# Patient Record
Sex: Female | Born: 1960 | Race: White | Hispanic: No | Marital: Married | Smoking: Former smoker
Health system: Southern US, Community
[De-identification: ages and names within clinical notes are randomized; demographics above are authoritative.]

## PROBLEM LIST (undated history)

## (undated) DIAGNOSIS — E039 Hypothyroidism, unspecified: Secondary | ICD-10-CM

## (undated) DIAGNOSIS — E038 Other specified hypothyroidism: Secondary | ICD-10-CM

## (undated) DIAGNOSIS — T7840XA Allergy, unspecified, initial encounter: Secondary | ICD-10-CM

## (undated) DIAGNOSIS — J45909 Unspecified asthma, uncomplicated: Secondary | ICD-10-CM

## (undated) HISTORY — DX: Hypothyroidism, unspecified: E03.9

## (undated) HISTORY — PX: SEPTOPLASTY: SHX2393

## (undated) HISTORY — DX: Other specified hypothyroidism: E03.8

## (undated) HISTORY — DX: Unspecified asthma, uncomplicated: J45.909

## (undated) HISTORY — DX: Allergy, unspecified, initial encounter: T78.40XA

## (undated) HISTORY — PX: MOUTH SURGERY: SHX715

---

## 2011-01-29 ENCOUNTER — Other Ambulatory Visit: Payer: Self-pay | Admitting: Family Medicine

## 2011-01-29 DIAGNOSIS — Z1231 Encounter for screening mammogram for malignant neoplasm of breast: Secondary | ICD-10-CM

## 2011-02-04 ENCOUNTER — Ambulatory Visit
Admission: RE | Admit: 2011-02-04 | Discharge: 2011-02-04 | Disposition: A | Discharge status: Home / Self Care | Payer: BC Managed Care – PPO | Source: Ambulatory Visit | Attending: Family Medicine | Admitting: Family Medicine

## 2011-02-04 DIAGNOSIS — Z1231 Encounter for screening mammogram for malignant neoplasm of breast: Secondary | ICD-10-CM

## 2011-02-12 ENCOUNTER — Other Ambulatory Visit: Payer: Self-pay | Admitting: Family Medicine

## 2011-02-12 DIAGNOSIS — R928 Other abnormal and inconclusive findings on diagnostic imaging of breast: Secondary | ICD-10-CM

## 2011-03-03 ENCOUNTER — Ambulatory Visit
Admission: RE | Admit: 2011-03-03 | Discharge: 2011-03-03 | Disposition: A | Discharge status: Home / Self Care | Payer: BC Managed Care – PPO | Source: Ambulatory Visit | Attending: Family Medicine | Admitting: Family Medicine

## 2011-03-03 DIAGNOSIS — R928 Other abnormal and inconclusive findings on diagnostic imaging of breast: Secondary | ICD-10-CM

## 2012-08-25 ENCOUNTER — Encounter: Payer: Self-pay | Admitting: Physician Assistant

## 2012-08-25 ENCOUNTER — Ambulatory Visit (INDEPENDENT_AMBULATORY_CARE_PROVIDER_SITE_OTHER): Payer: BC Managed Care – PPO | Admitting: Physician Assistant

## 2012-08-25 VITALS — BP 140/96 | HR 80 | Temp 98.5°F | Resp 18

## 2012-08-25 DIAGNOSIS — J22 Unspecified acute lower respiratory infection: Secondary | ICD-10-CM

## 2012-08-25 DIAGNOSIS — J988 Other specified respiratory disorders: Secondary | ICD-10-CM

## 2012-08-25 MED ORDER — ALBUTEROL SULFATE HFA 108 (90 BASE) MCG/ACT IN AERS: 2.0000 | INHALATION_SPRAY | Freq: Four times a day (QID) | RESPIRATORY_TRACT | Status: DC | PRN

## 2012-08-25 MED ORDER — AZITHROMYCIN 250 MG PO TABS: ORAL_TABLET | ORAL | Status: DC

## 2012-08-25 MED ORDER — PREDNISONE 20 MG PO TABS: ORAL_TABLET | ORAL | Status: DC

## 2012-08-25 NOTE — Progress Notes (Signed)
   Patient ID: KERISSA COIA MRN: [id number], DOB: 1960/09/08, 52 y.o. Date of Encounter: 08/25/2012, 12:26 PM    Chief Complaint:  Chief Complaint  Patient presents with  . wheezy, cough, sob, sinus pressure/drainage  discolored secr     HPI: 52 y.o. year old female reports that she has been sick for 7 days and getting worse. Now with a lot of chest congestion and cough. Yesterday had to use friend's inhaler-"If I hadn't had that, I would have had to go to ER" Has been wheezing. No h/o asthma. Has not smoked for 20 years.  Coughing up dark thick phlegm. Not blowing any mucus from nose. No fever, chills, sore throat, ear pain.    Home Meds: See Medication section for meds entered today No current outpatient prescriptions on file prior to visit.   No current facility-administered medications on file prior to visit.    Allergies:  Allergies  Allergen Reactions  . Erythromycin     Severe GI upset  Can take Azithromycin with no problem    Review of Systems: See HPI for Pertinent ROS. All othr ROS negative.   Physical Exam: Blood pressure 140/96, pulse 80, temperature 98.5 F (36.9 C), temperature source Oral, resp. rate 18, height 0' (0 m), weight 0 lb (0 kg), last menstrual period 08/04/2012., Cannot calculate BMI with a height equal to zero. General: Well developed, well nourished,WF in no acute distress. HEENT: Normocephalic, atraumatic, eyes without discharge, sclera non-icteric, nares are without discharge. Bilateral auditory canals clear, TM's are without perforation, pearly grey and translucent with reflective cone of light bilaterally. Oral cavity moist, posterior pharynx without exudate, erythema, peritonsillar abscess, or post nasal drip.  Neck: Supple. No thyromegaly. Full ROM. No lymphadenopathy. Lungs: She has a very congested cough. She has slight expiratory wheezes throughout but good air movement. Heart: RRR . No murmurs. Msk:  Strength and tone normal for  age. Extremities/Skin: Warm and dry. No clubbing or cyanosis. No edema. No rashes or suspicious lesions. Neuro: Alert and oriented X 3. Moves all extremities spontaneously. Gait is normal. CNII-XII grossly in tact. Psych:  Responds to questions appropriately with a normal affect.    ASSESSMENT AND PLAN:  52 y.o. year old female with  1. Lower respiratory tract infection - azithromycin (ZITHROMAX) 250 MG tablet; On Day 1: Take 2 pills daily On Days 2-5: Take 1 pill daily  Dispense: 6 tablet; Refill: 0 - albuterol (PROVENTIL HFA;VENTOLIN HFA) 108 (90 BASE) MCG/ACT inhaler; Inhale 2 puffs into the lungs every 6 (six) hours as needed for wheezing.  Dispense: 1 Inhaler; Refill: 0 - predniSONE (DELTASONE) 20 MG tablet; Take 3 daily for 2 days, then 2 daily for 2 days, then 1 daily for 2 days.  Dispense: 12 tablet; Refill: 0 Cont Mucinex DM. If worsens/ does not resolve, f/u.  [address] [place], [place], [facility] 08/25/2012 12:26 PM

## 2013-05-23 ENCOUNTER — Other Ambulatory Visit: Payer: Self-pay | Admitting: Physician Assistant

## 2013-05-23 NOTE — Telephone Encounter (Signed)
cpe in a few weeks.  One refill given

## 2013-05-25 ENCOUNTER — Other Ambulatory Visit: Payer: Self-pay | Admitting: Physician Assistant

## 2013-05-25 NOTE — Telephone Encounter (Signed)
CPE first week Feb.  One refill given

## 2013-06-15 ENCOUNTER — Other Ambulatory Visit: Payer: Self-pay | Admitting: Physician Assistant

## 2013-06-15 ENCOUNTER — Other Ambulatory Visit: Payer: BC Managed Care – PPO

## 2013-06-15 DIAGNOSIS — Z0000 Encounter for general adult medical examination without abnormal findings: Secondary | ICD-10-CM

## 2013-06-15 LAB — CBC WITH DIFFERENTIAL/PLATELET
BASOS ABS: 0.1 10*3/uL (ref 0.0–0.1)
BASOS PCT: 1 % (ref 0–1)
EOS ABS: 0.5 10*3/uL (ref 0.0–0.7)
Eosinophils Relative: 6 % — ABNORMAL HIGH (ref 0–5)
HCT: 40.2 % (ref 36.0–46.0)
Hemoglobin: 13.3 g/dL (ref 12.0–15.0)
Lymphocytes Relative: 23 % (ref 12–46)
Lymphs Abs: 1.9 10*3/uL (ref 0.7–4.0)
MCH: 29.6 pg (ref 26.0–34.0)
MCHC: 33.1 g/dL (ref 30.0–36.0)
MCV: 89.3 fL (ref 78.0–100.0)
MONOS PCT: 7 % (ref 3–12)
Monocytes Absolute: 0.6 10*3/uL (ref 0.1–1.0)
NEUTROS ABS: 5.3 10*3/uL (ref 1.7–7.7)
NEUTROS PCT: 63 % (ref 43–77)
PLATELETS: 318 10*3/uL (ref 150–400)
RBC: 4.5 MIL/uL (ref 3.87–5.11)
RDW: 13.4 % (ref 11.5–15.5)
WBC: 8.3 10*3/uL (ref 4.0–10.5)

## 2013-06-15 LAB — LIPID PANEL
CHOL/HDL RATIO: 3 ratio
Cholesterol: 208 mg/dL — ABNORMAL HIGH (ref 0–200)
HDL: 69 mg/dL (ref >39)
LDL CALC: 109 mg/dL — AB (ref 0–99)
Triglycerides: 152 mg/dL — ABNORMAL HIGH (ref <150)
VLDL: 30 mg/dL (ref 0–40)

## 2013-06-15 LAB — TSH: TSH: 4.946 u[IU]/mL — AB (ref 0.350–4.500)

## 2013-06-16 ENCOUNTER — Encounter: Payer: Self-pay | Admitting: Physician Assistant

## 2013-06-16 ENCOUNTER — Ambulatory Visit (INDEPENDENT_AMBULATORY_CARE_PROVIDER_SITE_OTHER): Payer: BC Managed Care – PPO | Admitting: Physician Assistant

## 2013-06-16 VITALS — BP 126/84 | HR 80 | Temp 97.9°F | Resp 18 | Ht 61.5 in | Wt 154.0 lb

## 2013-06-16 DIAGNOSIS — J22 Unspecified acute lower respiratory infection: Secondary | ICD-10-CM

## 2013-06-16 DIAGNOSIS — J45909 Unspecified asthma, uncomplicated: Secondary | ICD-10-CM | POA: Insufficient documentation

## 2013-06-16 DIAGNOSIS — J988 Other specified respiratory disorders: Secondary | ICD-10-CM

## 2013-06-16 DIAGNOSIS — Z309 Encounter for contraceptive management, unspecified: Secondary | ICD-10-CM | POA: Insufficient documentation

## 2013-06-16 DIAGNOSIS — Z23 Encounter for immunization: Secondary | ICD-10-CM

## 2013-06-16 DIAGNOSIS — E039 Hypothyroidism, unspecified: Secondary | ICD-10-CM | POA: Insufficient documentation

## 2013-06-16 DIAGNOSIS — T7840XA Allergy, unspecified, initial encounter: Secondary | ICD-10-CM | POA: Insufficient documentation

## 2013-06-16 DIAGNOSIS — Z0000 Encounter for general adult medical examination without abnormal findings: Secondary | ICD-10-CM | POA: Insufficient documentation

## 2013-06-16 DIAGNOSIS — E785 Hyperlipidemia, unspecified: Secondary | ICD-10-CM | POA: Insufficient documentation

## 2013-06-16 DIAGNOSIS — E038 Other specified hypothyroidism: Secondary | ICD-10-CM | POA: Insufficient documentation

## 2013-06-16 DIAGNOSIS — E559 Vitamin D deficiency, unspecified: Secondary | ICD-10-CM | POA: Insufficient documentation

## 2013-06-16 DIAGNOSIS — J302 Other seasonal allergic rhinitis: Secondary | ICD-10-CM | POA: Insufficient documentation

## 2013-06-16 DIAGNOSIS — J4520 Mild intermittent asthma, uncomplicated: Secondary | ICD-10-CM | POA: Insufficient documentation

## 2013-06-16 LAB — T4, FREE: FREE T4: 0.96 ng/dL (ref 0.80–1.80)

## 2013-06-16 LAB — VITAMIN D 25 HYDROXY (VIT D DEFICIENCY, FRACTURES): VIT D 25 HYDROXY: 40 ng/mL (ref 30–89)

## 2013-06-16 MED ORDER — ALBUTEROL SULFATE HFA 108 (90 BASE) MCG/ACT IN AERS: 2.0000 | INHALATION_SPRAY | Freq: Four times a day (QID) | RESPIRATORY_TRACT | Status: AC | PRN

## 2013-06-16 MED ORDER — NORGESTIMATE-ETH ESTRADIOL 0.25-35 MG-MCG PO TABS: ORAL_TABLET | ORAL | Status: DC

## 2013-06-16 NOTE — Progress Notes (Signed)
Patient ID: Terri Bean MRN: [id number], DOB: Jul 03, 1960, 53 y.o. Date of Encounter: 06/16/2013,   Chief Complaint: Physical (CPE)  HPI: 53 y.o. y/o female  here for CPE.   She reports that whenever her allergies act up she has some mild wheezing and uses her albuterol inhaler and his wheezing resolved easily with this. Says that this happens very rarely. The inhaler that she started this past April still has not run out.  She also wants to continue on her birth control pill. She is well aware that this can potentially increase risk of blood clots especially given her increasing age but wants to continue the medicine and is to accept this risk.  No other complaints. In general has been feeling very good. Does admit that her son has been very sick and she is constantly having to pick him up from school early etc. therefore has not gone for any mammogram since her last visit here. Says it would be impossible to get to it. Says that she has recently had to reschedule hair appointments 3 or 4 times before she can actually eating gets her hair appointments because of things going on with her son.   Review of Systems: Consitutional: No fever, chills, fatigue, night sweats, lymphadenopathy. No significant/unexplained weight changes. Eyes: No visual changes, eye redness, or discharge. ENT/Mouth: No ear pain, sore throat, nasal drainage, or sinus pain. Cardiovascular: No chest pressure,heaviness, tightness or squeezing, even with exertion. No increased shortness of breath or dyspnea on exertion.No palpitations, edema, orthopnea, PND. Respiratory: No cough, hemoptysis, SOB, or wheezing. Gastrointestinal: No anorexia, dysphagia, reflux, pain, nausea, vomiting, hematemesis, diarrhea, constipation, BRBPR, or melena. Breast: No mass, nodules, bulging, or retraction. No skin changes or inflammation. No nipple discharge. No lymphadenopathy. Genitourinary: No dysuria, hematuria, incontinence, vaginal  discharge, pruritis, burning, abnormal bleeding, or pain. Musculoskeletal: No decreased ROM, No joint pain or swelling. No significant pain in neck, back, or extremities. Skin: No rash, pruritis, or concerning lesions. Neurological: No headache, dizziness, syncope, seizures, tremors, memory loss, coordination problems, or paresthesias. Psychological: No anxiety, depression, hallucinations, SI/HI. Endocrine: No polydipsia, polyphagia, polyuria, or known diabetes.No increased fatigue. No palpitations/rapid heart rate. No significant/unexplained weight change. All other systems were reviewed and are otherwise negative.  Past Medical History  Diagnosis Date  . Allergy   . Asthma   . Subclinical hypothyroidism      Past Surgical History  Procedure Laterality Date  . Cesarean section      x 2  . Septoplasty    . Mouth surgery      Home Meds:  Current Outpatient Prescriptions on File Prior to Visit  Medication Sig Dispense Refill  . diphenhydrAMINE (SOMINEX) 25 MG tablet Take 25 mg by mouth at bedtime as needed for sleep.       No current facility-administered medications on file prior to visit.    Allergies:  Allergies  Allergen Reactions  . Erythromycin     Severe GI upset    History   Social History  . Marital Status: Married    Spouse Name: N/A    Number of Children: N/A  . Years of Education: N/A   Occupational History  . Not on file.   Social History Main Topics  . Smoking status: Former Smoker    Quit date: 08/25/1988  . Smokeless tobacco: Never Used  . Alcohol Use: No  . Drug Use: No  . Sexual Activity: Yes    Birth Control/ Protection: Pill   Other  Topics Concern  . Not on file   Social History Narrative   Works as Environmental health practitioner.   Does no formal/routine exercise but is very active in general    eats fairly healthy diet.    Family History  Problem Relation Age of Onset  . Cancer Mother     ovarian,peritoneal  . Heart disease Father       hx MI,pacemaker  . Hypertension Father     Physical Exam: Blood pressure 126/84, pulse 80, temperature 97.9 F (36.6 C), temperature source Oral, resp. rate 18, height 5' 1.5" (1.562 m), weight 154 lb (69.854 kg), last menstrual period 05/27/2013., Body mass index is 28.63 kg/(m^2). General: Well developed, well nourished,WF. Appears in no acute distress. HEENT: Normocephalic, atraumatic. Conjunctiva pink, sclera non-icteric. Pupils 2 mm constricting to 1 mm, round, regular, and equally reactive to light and accomodation. EOMI. Internal auditory canal clear. TMs with good cone of light and without pathology. Nasal mucosa pink. Nares are without discharge. No sinus tenderness. Oral mucosa pink.  Pharynx without exudate.   Neck: Supple. Trachea midline. No thyromegaly. Full ROM. No lymphadenopathy.No Carotid Bruits. Lungs: Clear to auscultation bilaterally without wheezes, rales, or rhonchi. Breathing is of normal effort and unlabored. Cardiovascular: RRR with S1 S2. No murmurs, rubs, or gallops. Distal pulses 2+ symmetrically. No carotid or abdominal bruits. Breast: Symmetrical. No masses. Nipples without discharge. Abdomen: Soft, non-tender, non-distended with normoactive bowel sounds. No hepatosplenomegaly or masses. No rebound/guarding. No CVA tenderness. No hernias.  Genitourinary:  External genitalia without lesions. Vaginal mucosa pink.No discharge present. Cervix pink and without discharge. No cervical tenderness.Normal uterus size. No adnexal mass or tenderness.  Musculoskeletal: Full range of motion and 5/5 strength throughout. Without swelling, atrophy, tenderness, crepitus, or warmth. Extremities without clubbing, cyanosis, or edema. Calves supple. Skin: Warm and moist without erythema, ecchymosis, wounds, or rash. Neuro: A+Ox3. CN II-XII grossly intact. Moves all extremities spontaneously. Full sensation throughout. Normal gait. DTR 2+ throughout upper and lower extremities. Finger to  nose intact. Psych:  Responds to questions appropriately with a normal affect.   Assessment/Plan:  53 y.o. y/o female here for CPE 1. Visit for preventive health examination   A. Screening Labs: SHe came in had these done fasting yesterday morning. These are all excellent. TSH is borderline so we'll add a free T4 to further evaluate this.  B. Pap: Last Pap smear was 01/2011. At that pap she had both cytology and had cotesting with HPV. Therefore can wait 5 years to repeat. Due September 2017.  C. Screening Mammogram: Last one was 03/03/2011. Negative. See history of present illness. She is well aware that mammogram is due and needs to be done but see history of present illness. She will call to schedule this as soon as she possibly can given her schedule and problems with her son.  E. Colorectal Cancer Screening: Discussed that now that she is age 32 colonoscopy is recommended. Again, see history of present illness. She will call me if things calm down in regards to her son and her schedule and then we can refer her at that time.  F. Immunizations:  Tetanus: Has not had in greater than 10 years. This is due. She is agreeable and will get this today.     2. Contraception management - norgestimate-ethinyl estradiol (SPRINTEC 28) 0.25-35 MG-MCG tablet; USE AS DIRECTED  Dispense: 28 tablet; Refill: 11  3. Subclinical hypothyroidism TSH was done when lab work yesterday we'll add a free T4.  4. Allergy  5. Asthma - albuterol (PROVENTIL HFA;VENTOLIN HFA) 108 (90 BASE) MCG/ACT inhaler; Inhale 2 puffs into the lungs every 6 (six) hours as needed for wheezing.  Dispense: 1 Inhaler; Refill: 0  F/U Office visit one year or sooner if needed.  Signed, [address] [place], [place], [facility] 06/16/2013 9:03 AM

## 2014-05-02 ENCOUNTER — Other Ambulatory Visit: Payer: Self-pay

## 2014-05-02 DIAGNOSIS — Z1231 Encounter for screening mammogram for malignant neoplasm of breast: Secondary | ICD-10-CM

## 2014-05-10 ENCOUNTER — Other Ambulatory Visit: Payer: Self-pay

## 2014-05-10 ENCOUNTER — Ambulatory Visit
Admission: RE | Admit: 2014-05-10 | Discharge: 2014-05-10 | Disposition: A | Discharge status: Home / Self Care | Payer: BC Managed Care – PPO | Source: Ambulatory Visit

## 2014-05-10 DIAGNOSIS — Z1231 Encounter for screening mammogram for malignant neoplasm of breast: Secondary | ICD-10-CM

## 2014-06-12 ENCOUNTER — Encounter: Payer: Self-pay | Admitting: Family Medicine

## 2014-06-12 ENCOUNTER — Other Ambulatory Visit: Payer: Self-pay | Admitting: Physician Assistant

## 2014-06-12 NOTE — Telephone Encounter (Signed)
Medication refill for one time only.  Patient needs to be seen.  Letter sent for patient to call and schedule 

## 2014-07-08 ENCOUNTER — Other Ambulatory Visit: Payer: Self-pay | Admitting: Physician Assistant

## 2014-07-10 NOTE — Telephone Encounter (Signed)
Medication refilled per protocol.  Has CPE next week

## 2014-07-19 ENCOUNTER — Encounter: Payer: Self-pay | Admitting: Physician Assistant

## 2014-08-05 ENCOUNTER — Other Ambulatory Visit: Payer: Self-pay | Admitting: Physician Assistant

## 2014-08-07 NOTE — Telephone Encounter (Signed)
Medication refilled per protocol. 

## 2014-08-16 ENCOUNTER — Encounter: Payer: Self-pay | Admitting: Physician Assistant

## 2014-09-03 ENCOUNTER — Other Ambulatory Visit: Payer: Self-pay | Admitting: Physician Assistant

## 2014-09-04 ENCOUNTER — Other Ambulatory Visit: Payer: BLUE CROSS/BLUE SHIELD

## 2014-09-04 DIAGNOSIS — Z79899 Other long term (current) drug therapy: Secondary | ICD-10-CM

## 2014-09-04 DIAGNOSIS — Z308 Encounter for other contraceptive management: Secondary | ICD-10-CM

## 2014-09-04 DIAGNOSIS — E039 Hypothyroidism, unspecified: Secondary | ICD-10-CM

## 2014-09-04 DIAGNOSIS — Z0000 Encounter for general adult medical examination without abnormal findings: Secondary | ICD-10-CM

## 2014-09-04 DIAGNOSIS — E038 Other specified hypothyroidism: Secondary | ICD-10-CM

## 2014-09-04 LAB — CBC WITH DIFFERENTIAL/PLATELET
BASOS ABS: 0.1 10*3/uL (ref 0.0–0.1)
Basophils Relative: 1 % (ref 0–1)
EOS PCT: 8 % — AB (ref 0–5)
Eosinophils Absolute: 0.6 10*3/uL (ref 0.0–0.7)
HEMATOCRIT: 40.4 % (ref 36.0–46.0)
Hemoglobin: 13.7 g/dL (ref 12.0–15.0)
LYMPHS ABS: 2 10*3/uL (ref 0.7–4.0)
LYMPHS PCT: 26 % (ref 12–46)
MCH: 30.8 pg (ref 26.0–34.0)
MCHC: 33.9 g/dL (ref 30.0–36.0)
MCV: 90.8 fL (ref 78.0–100.0)
MPV: 9.7 fL (ref 8.6–12.4)
Monocytes Absolute: 0.6 10*3/uL (ref 0.1–1.0)
Monocytes Relative: 8 % (ref 3–12)
NEUTROS PCT: 57 % (ref 43–77)
Neutro Abs: 4.4 10*3/uL (ref 1.7–7.7)
Platelets: 318 10*3/uL (ref 150–400)
RBC: 4.45 MIL/uL (ref 3.87–5.11)
RDW: 13.9 % (ref 11.5–15.5)
WBC: 7.8 10*3/uL (ref 4.0–10.5)

## 2014-09-04 LAB — LIPID PANEL
Cholesterol: 215 mg/dL — ABNORMAL HIGH (ref 0–200)
HDL: 72 mg/dL (ref >=46)
LDL Cholesterol: 113 mg/dL — ABNORMAL HIGH (ref 0–99)
TRIGLYCERIDES: 151 mg/dL — AB (ref <150)
Total CHOL/HDL Ratio: 3 Ratio
VLDL: 30 mg/dL (ref 0–40)

## 2014-09-04 LAB — COMPLETE METABOLIC PANEL WITH GFR
ALT: 11 U/L (ref 0–35)
AST: 15 U/L (ref 0–37)
Albumin: 3.9 g/dL (ref 3.5–5.2)
Alkaline Phosphatase: 50 U/L (ref 39–117)
BUN: 10 mg/dL (ref 6–23)
CALCIUM: 9.1 mg/dL (ref 8.4–10.5)
CHLORIDE: 103 meq/L (ref 96–112)
CO2: 27 mEq/L (ref 19–32)
Creat: 0.66 mg/dL (ref 0.50–1.10)
GFR, Est Non African American: >89 mL/min
Glucose, Bld: 77 mg/dL (ref 70–99)
Potassium: 4.9 mEq/L (ref 3.5–5.3)
SODIUM: 138 meq/L (ref 135–145)
Total Bilirubin: 0.4 mg/dL (ref 0.2–1.2)
Total Protein: 6.5 g/dL (ref 6.0–8.3)

## 2014-09-04 LAB — TSH: TSH: 5.871 u[IU]/mL — ABNORMAL HIGH (ref 0.350–4.500)

## 2014-09-04 LAB — T4, FREE: FREE T4: 0.82 ng/dL (ref 0.80–1.80)

## 2014-09-04 NOTE — Telephone Encounter (Signed)
One refill to hold until up coming CPE

## 2014-09-06 ENCOUNTER — Encounter: Payer: Self-pay | Admitting: Physician Assistant

## 2014-09-06 ENCOUNTER — Ambulatory Visit (INDEPENDENT_AMBULATORY_CARE_PROVIDER_SITE_OTHER): Payer: BLUE CROSS/BLUE SHIELD | Admitting: Physician Assistant

## 2014-09-06 VITALS — BP 124/76 | HR 72 | Temp 98.0°F | Resp 18

## 2014-09-06 DIAGNOSIS — Z0000 Encounter for general adult medical examination without abnormal findings: Secondary | ICD-10-CM | POA: Diagnosis not present

## 2014-09-06 DIAGNOSIS — Z3041 Encounter for surveillance of contraceptive pills: Secondary | ICD-10-CM

## 2014-09-06 DIAGNOSIS — J4520 Mild intermittent asthma, uncomplicated: Secondary | ICD-10-CM

## 2014-09-06 DIAGNOSIS — E038 Other specified hypothyroidism: Secondary | ICD-10-CM

## 2014-09-06 DIAGNOSIS — E039 Hypothyroidism, unspecified: Secondary | ICD-10-CM

## 2014-09-06 NOTE — Progress Notes (Signed)
Patient ID: KEIANNA SIGNER MRN: [id number], DOB: 12/18/1960, 54 y.o. Date of Encounter: 09/06/2014,   Chief Complaint: Physical (CPE)  HPI: 54 y.o. y/o female  here for CPE.   Her last visit here was for her annual complete physical exam with me 06/16/2013. She states that she has had no medical issues arise since then. Has been feeling good over the past year.  She reports that whenever her allergies act up she has some mild wheezing and uses her albuterol inhaler and his wheezing resolved easily with this. Says that this happens very rarely.  At her CPE 06/2013--She wanted to continue on her birth control pill. She said she was well aware that this can potentially increase risk of blood clots especially given her increasing age but wanted to continue the medicine and accepted this risk. At CPE 08/2014 when I discussed stopping this she says that she is agreeable to try going off of this for couple months and see whether she even has menses. Does not know whether she may be menopausal at this point. She says that she knows at this age the chances of getting pregnant are extremely low, but she says that she wants to know that the chances are 0.  No other complaints. In general has been feeling very good. At her CPE 06/2013--she said that her son has been very sick and she was constantly having to pick him up from school early etc. therefore had not gone for any mammogram since her last visit here. Michela Pitcher it would be impossible to get to it. Said that she has recently had to reschedule hair appointments 3 or 4 times before she can actually eating gets her hair appointments because of things going on with her son. At visit 08/2014 discussed this.  Says that her son has a lot of problems with panic anxiety and depression. Says that he is graduating high school this year then plans to go to [place] but will be living at home. Says that if someone met him they would not even notice there was anything wrong  but that he does have to take multiple medications to be stable. Says currently he has been stable and doing well recently but she never knows when he may have a flare. She says that she was recently able to go get her mammogram and this is now up-to-date! However still is reluctant to schedule colonoscopy because she never knows when something is going to, but the last second and she cannot get to things. Also says that with things being busy with his graduation etc. she knows it will be at least be Fall of this year before she could possibly schedule colonoscopy.   Review of Systems: Consitutional: No fever, chills, fatigue, night sweats, lymphadenopathy. No significant/unexplained weight changes. Eyes: No visual changes, eye redness, or discharge. ENT/Mouth: No ear pain, sore throat, nasal drainage, or sinus pain. Cardiovascular: No chest pressure,heaviness, tightness or squeezing, even with exertion. No increased shortness of breath or dyspnea on exertion.No palpitations, edema, orthopnea, PND. Respiratory: No cough, hemoptysis, SOB, or wheezing. Gastrointestinal: No anorexia, dysphagia, reflux, pain, nausea, vomiting, hematemesis, diarrhea, constipation, BRBPR, or melena. Breast: No mass, nodules, bulging, or retraction. No skin changes or inflammation. No nipple discharge. No lymphadenopathy. Genitourinary: No dysuria, hematuria, incontinence, vaginal discharge, pruritis, burning, abnormal bleeding, or pain. Musculoskeletal: No decreased ROM, No joint pain or swelling. No significant pain in neck, back, or extremities. Skin: No rash, pruritis, or concerning lesions. Neurological: No  headache, dizziness, syncope, seizures, tremors, memory loss, coordination problems, or paresthesias. Psychological: No anxiety, depression, hallucinations, SI/HI. Endocrine: No polydipsia, polyphagia, polyuria, or known diabetes.No increased fatigue. No palpitations/rapid heart rate. No significant/unexplained  weight change. All other systems were reviewed and are otherwise negative.  Past Medical History  Diagnosis Date  . Allergy   . Asthma   . Subclinical hypothyroidism      Past Surgical History  Procedure Laterality Date  . Cesarean section      x 2  . Septoplasty    . Mouth surgery      Home Meds:  Current Outpatient Prescriptions on File Prior to Visit  Medication Sig Dispense Refill  . albuterol (PROVENTIL HFA;VENTOLIN HFA) 108 (90 BASE) MCG/ACT inhaler Inhale 2 puffs into the lungs every 6 (six) hours as needed for wheezing. 1 Inhaler 0  . diphenhydrAMINE (SOMINEX) 25 MG tablet Take 25 mg by mouth at bedtime as needed for sleep.    . SPRINTEC 28 0.25-35 MG-MCG tablet TAKE 1 TABLET BY MOUTH EVERY DAY 28 tablet 0   No current facility-administered medications on file prior to visit.    Allergies:  Allergies  Allergen Reactions  . Erythromycin     Severe GI upset    History   Social History  . Marital Status: Married    Spouse Name: N/A  . Number of Children: N/A  . Years of Education: N/A   Occupational History  . Not on file.   Social History Main Topics  . Smoking status: Former Smoker    Quit date: 08/25/1988  . Smokeless tobacco: Never Used  . Alcohol Use: No  . Drug Use: No  . Sexual Activity: Yes    Birth Control/ Protection: Pill   Other Topics Concern  . Not on file   Social History Narrative   Works as Web designer.   Does no formal/routine exercise but is very active in general    eats fairly healthy diet.   Married.    Has 2 children.   Entered 08/2014: Her son graduates highschool this year. Then plans to go to [employer]--will live at home.    He has a lot of problems with Panic, Anxiety, Depression.    At times, when his problems are not stable, pt has to be present for a lot of appointments with him, etc.   She also has a daughter who is age 15 and lives in [place] and works as a Haematologist.  Family History  Problem  Relation Age of Onset  . Cancer Mother     ovarian,peritoneal  . Heart disease Father     hx MI,pacemaker  . Hypertension Father     Physical Exam: Blood pressure 124/76, pulse 72, temperature 98 F (36.7 C), temperature source Oral, resp. rate 18, last menstrual period 08/25/2014., There is no weight on file to calculate BMI. General: Well developed, well nourished,WF. Appears in no acute distress. HEENT: Normocephalic, atraumatic. Conjunctiva pink, sclera non-icteric. Pupils 2 mm constricting to 1 mm, round, regular, and equally reactive to light and accomodation. EOMI. Internal auditory canal clear. TMs with good cone of light and without pathology. Nasal mucosa pink. Nares are without discharge. No sinus tenderness. Oral mucosa pink.  Pharynx without exudate.   Neck: Supple. Trachea midline. No thyromegaly. Full ROM. No lymphadenopathy.No Carotid Bruits. Lungs: Clear to auscultation bilaterally without wheezes, rales, or rhonchi. Breathing is of normal effort and unlabored. Cardiovascular: RRR with S1 S2. No murmurs, rubs, or gallops. Distal pulses 2+  symmetrically. No carotid or abdominal bruits. Breast: Symmetrical. No masses. Nipples without discharge. Abdomen: Soft, non-tender, non-distended with normoactive bowel sounds. No hepatosplenomegaly or masses. No rebound/guarding. No CVA tenderness. No hernias.  Genitourinary:  External genitalia without lesions. Vaginal mucosa pink.No discharge present. Cervix pink and without discharge. No cervical tenderness.Normal uterus size. No adnexal mass or tenderness.  Musculoskeletal: Full range of motion and 5/5 strength throughout. Without swelling, atrophy, tenderness, crepitus, or warmth. Extremities without clubbing, cyanosis, or edema. Calves supple. Skin: Warm and moist without erythema, ecchymosis, wounds, or rash. Neuro: A+Ox3. CN II-XII grossly intact. Moves all extremities spontaneously. Full sensation throughout. Normal gait. DTR 2+  throughout upper and lower extremities. Finger to nose intact. Psych:  Responds to questions appropriately with a normal affect.   Assessment/Plan:  54 y.o. y/o female here for CPE 1. Visit for preventive health examination   A. Screening Labs: SHe came in had these done fasting yesterday. These are all excellent. TSH slightly elevated,  free T4 normal--will cont to monitor this.  B. Pap: Last Pap smear was 01/2011. At that pap she had both cytology and had cotesting with HPV. Therefore can wait 5 years to repeat. Due September 2017.  C. Screening Mammogram: At visit 08/2014 she reports that she did recently have mammogram which was negative.  E. Colorectal Cancer Screening: Discussed that now that she is age 71 colonoscopy is recommended. Again, see history of present illness. She will call me if things calm down in regards to her son and her schedule and then we can refer her at that time.  F. Immunizations:  Influenza: N/A Tetanus: Tdap given here 06/16/2013 Pneumonia vaccine--- and wait until age 23 for this. Technically she has asthma but this is very rare. Zostavax--Discuss at age 55      2. Contraception management She is agreeable to have a trial off of OCT  3. Subclinical hypothyroidism Stable. We'll continue to monitor  4. Allergy  5. Asthma - albuterol (PROVENTIL HFA;VENTOLIN HFA) 108 (90 BASE) MCG/ACT inhaler; Inhale 2 puffs into the lungs every 6 (six) hours as needed for wheezing.  Dispense: 1 Inhaler; Refill: 0  F/U Office visit one year or sooner if needed.  Signed, [address] [place], [place], [facility] 09/06/2014 11:20 AM

## 2014-09-23 ENCOUNTER — Other Ambulatory Visit: Payer: Self-pay | Admitting: Physician Assistant

## 2014-09-25 NOTE — Telephone Encounter (Signed)
Medication refilled per protocol. 

## 2015-07-11 IMAGING — MG MM SCREEN MAMMOGRAM BILATERAL
6 series · 6 of 6 positions shown · non-contrast
Comparison: Previous exam(s).

CLINICAL DATA: Screening.

EXAM:
DIGITAL SCREENING BILATERAL MAMMOGRAM WITH CAD

[R CC (1 of 2)]
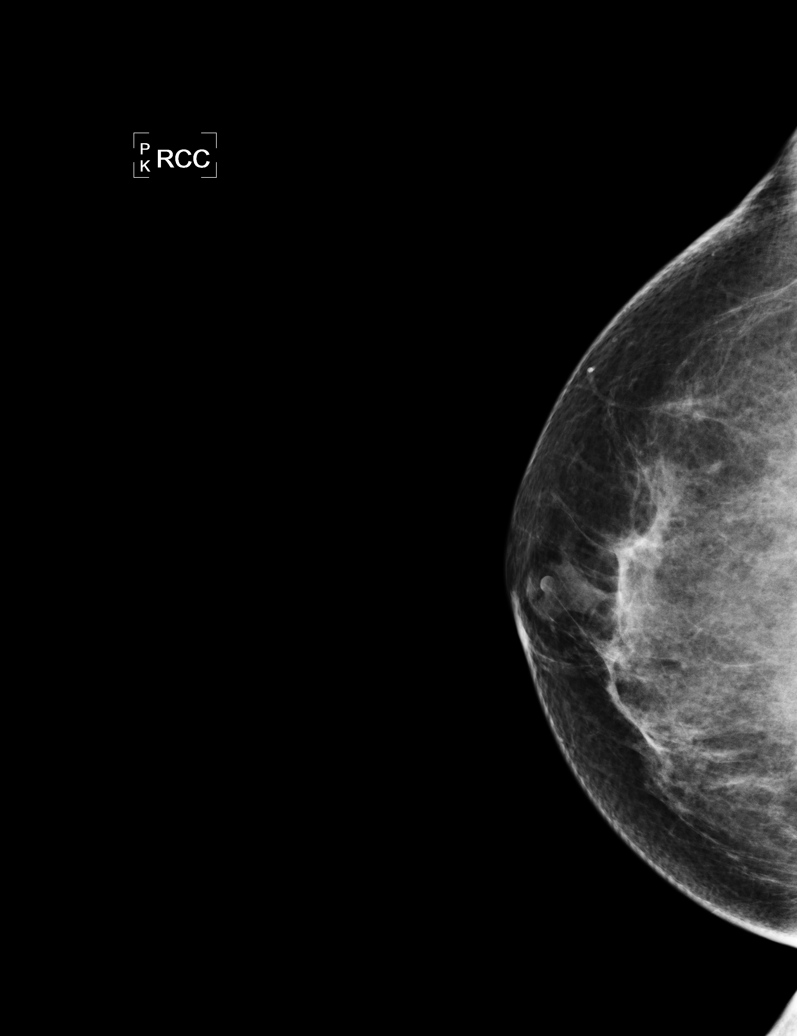

[L CC]
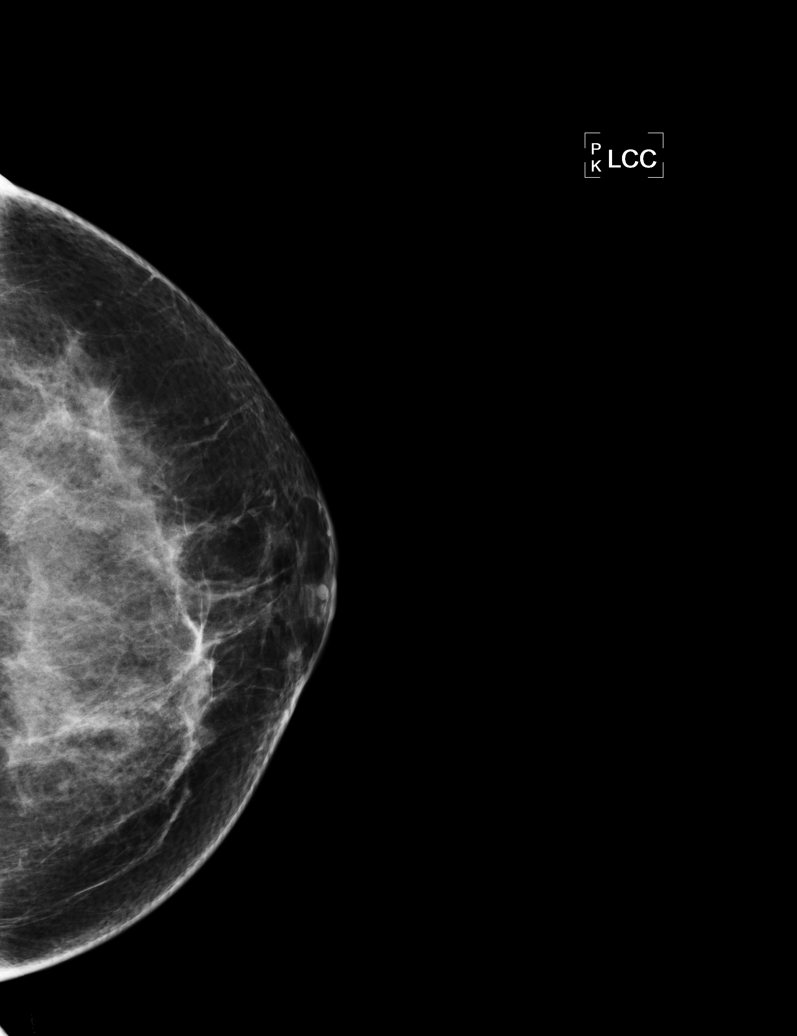

[L MLO]
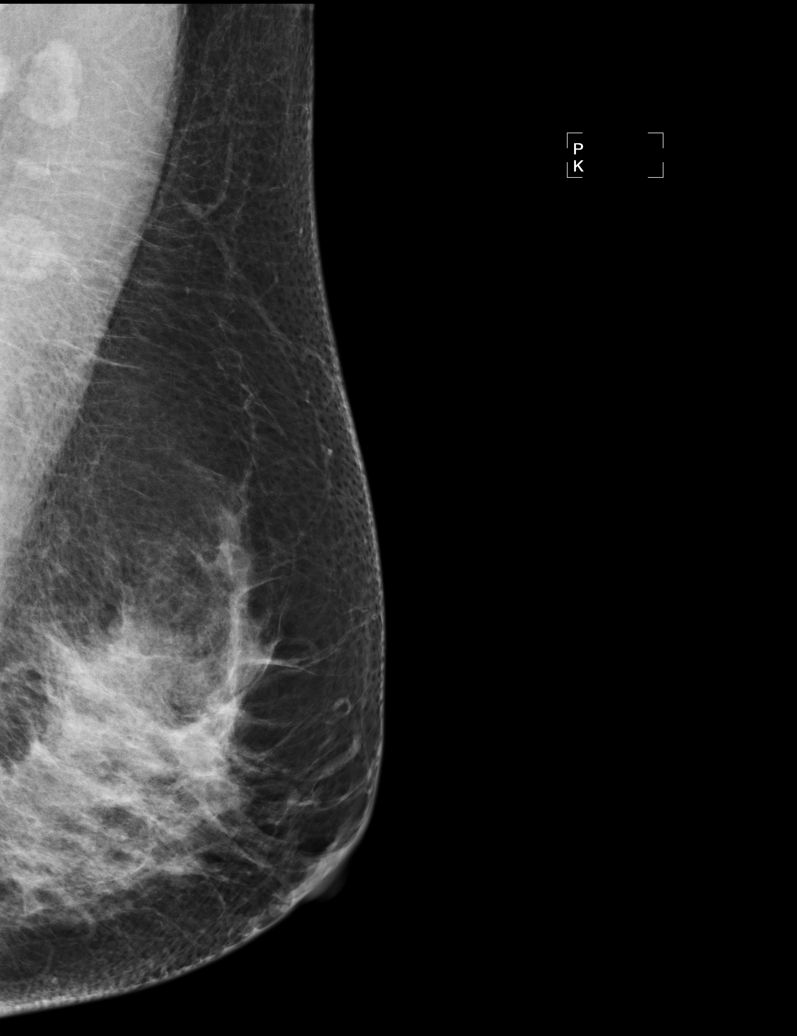

[R MLO (1 of 2)]
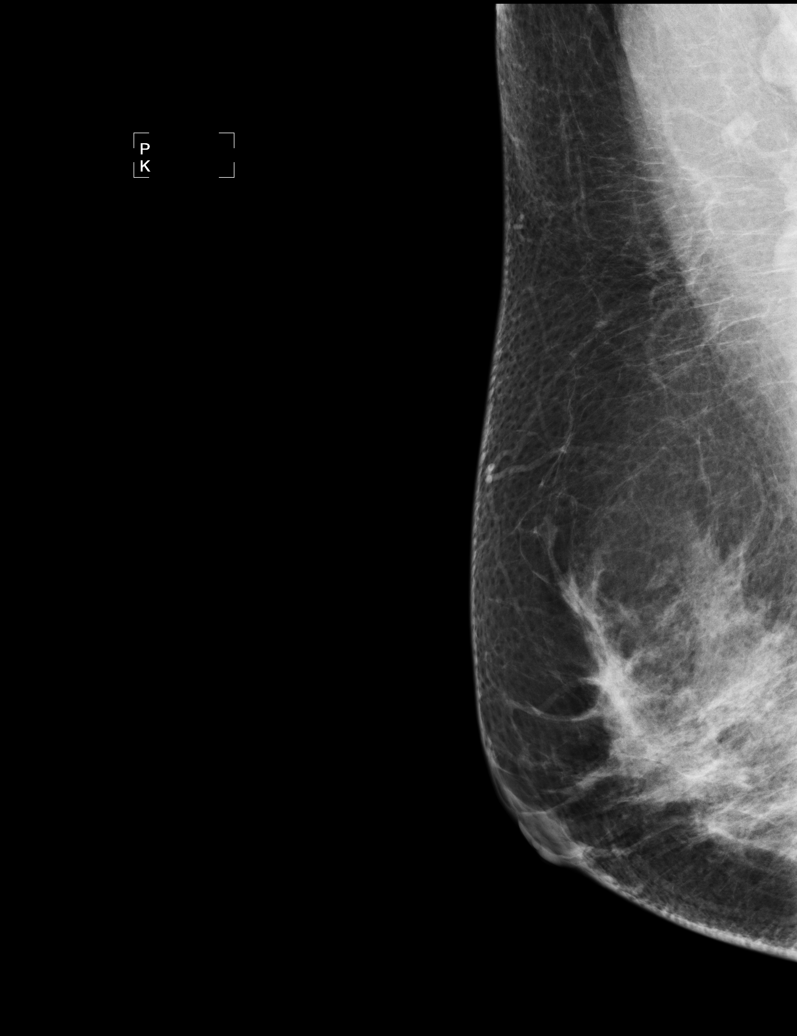

[R CC (2 of 2)]
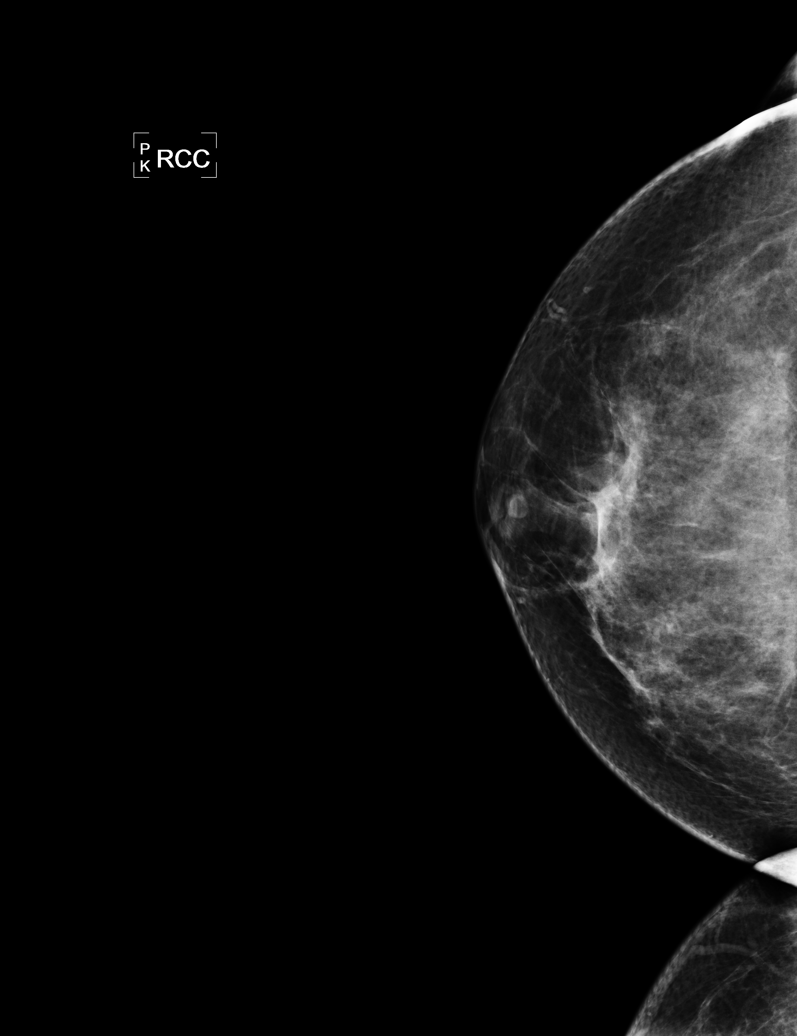

[R MLO (2 of 2)]
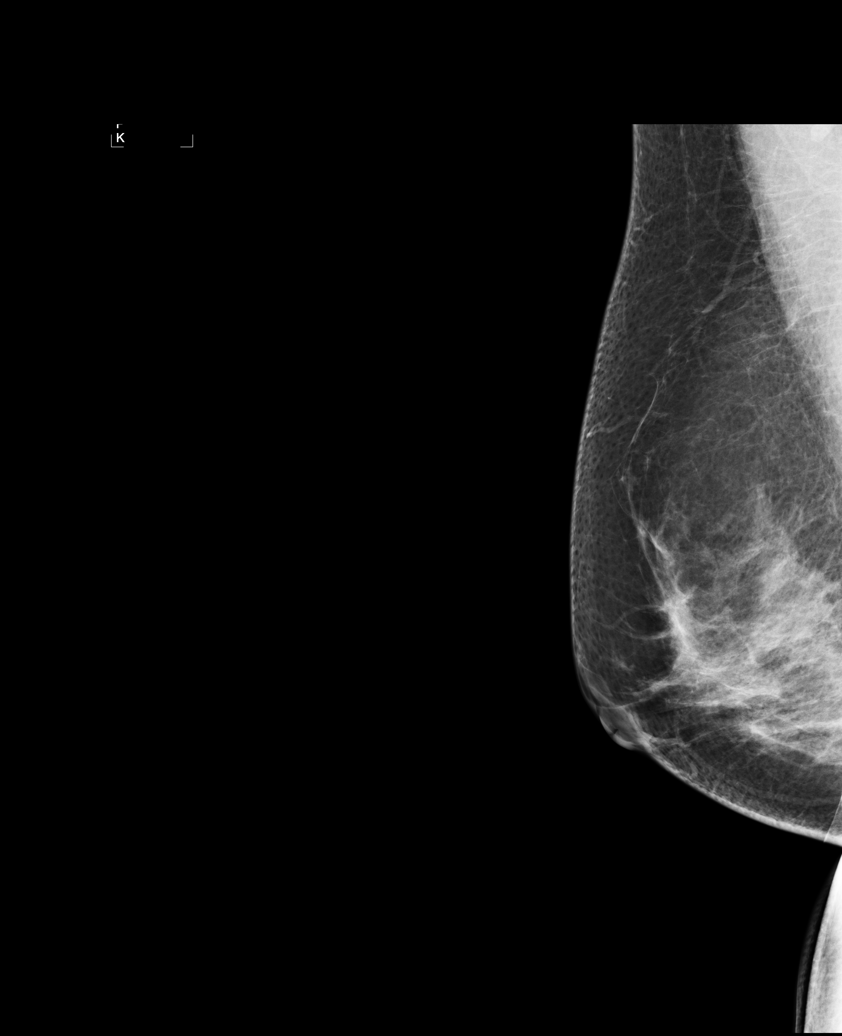

[6 of 6 positions shown; findings below may reference images not displayed]

ACR Breast Density Category c: The breast tissue is heterogeneously
dense, which may obscure small masses.
FINDINGS: There are no findings suspicious for malignancy. Images were
processed with CAD.
IMPRESSION: No mammographic evidence of malignancy. A result letter of this
screening mammogram will be mailed directly to the patient.

RECOMMENDATION:
Screening mammogram in one year. (Code:YJ-2-FEZ)

BI-RADS CATEGORY  1: Negative.

## 2015-09-04 ENCOUNTER — Other Ambulatory Visit: Payer: Self-pay | Admitting: Physician Assistant

## 2015-09-04 NOTE — Telephone Encounter (Signed)
Medication refilled per protocol. 

## 2016-04-29 ENCOUNTER — Other Ambulatory Visit: Payer: Self-pay | Admitting: Physician Assistant

## 2016-08-22 ENCOUNTER — Other Ambulatory Visit: Payer: Self-pay | Admitting: Physician Assistant

## 2016-08-22 NOTE — Telephone Encounter (Signed)
Medication refill for one time only.  Patient needs to be seen.  Letter sent for patient to call and schedule 

## 2016-09-16 ENCOUNTER — Other Ambulatory Visit: Payer: Self-pay

## 2016-09-17 ENCOUNTER — Other Ambulatory Visit: Payer: Self-pay

## 2016-10-01 ENCOUNTER — Other Ambulatory Visit: Payer: Self-pay

## 2016-10-01 MED ORDER — NORGESTIMATE-ETH ESTRADIOL 0.25-35 MG-MCG PO TABS: 1.0000 | ORAL_TABLET | Freq: Every day | ORAL | 0 refills | Status: DC

## 2016-10-01 NOTE — Telephone Encounter (Signed)
RX  filled per protocol. Pt due for an office visit letter mailed as well as sent via my chart

## 2016-10-28 ENCOUNTER — Other Ambulatory Visit: Payer: Self-pay | Admitting: Physician Assistant

## 2016-10-28 NOTE — Telephone Encounter (Signed)
rx filled per protocol. Pt due for office visit letter sent via my chart as well as mailed

## 2016-11-25 ENCOUNTER — Other Ambulatory Visit: Payer: Self-pay | Admitting: Physician Assistant

## 2016-11-25 NOTE — Telephone Encounter (Signed)
Patient is due for an office visit. Letter sent via my chart

## 2019-07-17 DIAGNOSIS — Z20828 Contact with and (suspected) exposure to other viral communicable diseases: Secondary | ICD-10-CM | POA: Diagnosis not present

## 2019-09-20 DIAGNOSIS — Z85828 Personal history of other malignant neoplasm of skin: Secondary | ICD-10-CM | POA: Diagnosis not present

## 2019-09-20 DIAGNOSIS — C4441 Basal cell carcinoma of skin of scalp and neck: Secondary | ICD-10-CM | POA: Diagnosis not present

## 2019-09-20 DIAGNOSIS — C44519 Basal cell carcinoma of skin of other part of trunk: Secondary | ICD-10-CM | POA: Diagnosis not present

## 2019-09-20 DIAGNOSIS — D485 Neoplasm of uncertain behavior of skin: Secondary | ICD-10-CM | POA: Diagnosis not present

## 2019-09-20 DIAGNOSIS — L570 Actinic keratosis: Secondary | ICD-10-CM | POA: Diagnosis not present

## 2019-11-03 DIAGNOSIS — L821 Other seborrheic keratosis: Secondary | ICD-10-CM | POA: Diagnosis not present

## 2019-11-03 DIAGNOSIS — Z85828 Personal history of other malignant neoplasm of skin: Secondary | ICD-10-CM | POA: Diagnosis not present

## 2019-11-03 DIAGNOSIS — C44612 Basal cell carcinoma of skin of right upper limb, including shoulder: Secondary | ICD-10-CM | POA: Diagnosis not present

## 2019-11-03 DIAGNOSIS — L570 Actinic keratosis: Secondary | ICD-10-CM | POA: Diagnosis not present

## 2019-11-03 DIAGNOSIS — C44519 Basal cell carcinoma of skin of other part of trunk: Secondary | ICD-10-CM | POA: Diagnosis not present

## 2019-12-09 DIAGNOSIS — Z20822 Contact with and (suspected) exposure to covid-19: Secondary | ICD-10-CM | POA: Diagnosis not present

## 2019-12-23 DIAGNOSIS — Z1231 Encounter for screening mammogram for malignant neoplasm of breast: Secondary | ICD-10-CM | POA: Diagnosis not present

## 2020-02-14 DIAGNOSIS — C44612 Basal cell carcinoma of skin of right upper limb, including shoulder: Secondary | ICD-10-CM | POA: Diagnosis not present

## 2020-02-14 DIAGNOSIS — C44519 Basal cell carcinoma of skin of other part of trunk: Secondary | ICD-10-CM | POA: Diagnosis not present

## 2020-02-14 DIAGNOSIS — Z85828 Personal history of other malignant neoplasm of skin: Secondary | ICD-10-CM | POA: Diagnosis not present

## 2020-05-07 DIAGNOSIS — L821 Other seborrheic keratosis: Secondary | ICD-10-CM | POA: Diagnosis not present

## 2020-05-07 DIAGNOSIS — C44719 Basal cell carcinoma of skin of left lower limb, including hip: Secondary | ICD-10-CM | POA: Diagnosis not present

## 2020-05-07 DIAGNOSIS — Z85828 Personal history of other malignant neoplasm of skin: Secondary | ICD-10-CM | POA: Diagnosis not present

## 2020-05-07 DIAGNOSIS — L814 Other melanin hyperpigmentation: Secondary | ICD-10-CM | POA: Diagnosis not present

## 2020-05-07 DIAGNOSIS — L570 Actinic keratosis: Secondary | ICD-10-CM | POA: Diagnosis not present

## 2020-09-14 DIAGNOSIS — R062 Wheezing: Secondary | ICD-10-CM | POA: Diagnosis not present

## 2020-09-14 DIAGNOSIS — J40 Bronchitis, not specified as acute or chronic: Secondary | ICD-10-CM | POA: Diagnosis not present

## 2020-09-14 DIAGNOSIS — R059 Cough, unspecified: Secondary | ICD-10-CM | POA: Diagnosis not present

## 2020-09-14 DIAGNOSIS — Z8709 Personal history of other diseases of the respiratory system: Secondary | ICD-10-CM | POA: Diagnosis not present

## 2020-12-28 ENCOUNTER — Encounter: Payer: Self-pay | Admitting: Family Medicine

## 2020-12-28 DIAGNOSIS — Z1231 Encounter for screening mammogram for malignant neoplasm of breast: Secondary | ICD-10-CM | POA: Diagnosis not present

## 2021-01-04 DIAGNOSIS — J4531 Mild persistent asthma with (acute) exacerbation: Secondary | ICD-10-CM | POA: Diagnosis not present

## 2021-02-20 DIAGNOSIS — J4550 Severe persistent asthma, uncomplicated: Secondary | ICD-10-CM | POA: Diagnosis not present

## 2021-02-20 DIAGNOSIS — J301 Allergic rhinitis due to pollen: Secondary | ICD-10-CM | POA: Diagnosis not present

## 2021-02-20 DIAGNOSIS — J4551 Severe persistent asthma with (acute) exacerbation: Secondary | ICD-10-CM | POA: Diagnosis not present

## 2021-02-20 DIAGNOSIS — Z91018 Allergy to other foods: Secondary | ICD-10-CM | POA: Diagnosis not present

## 2021-03-27 DIAGNOSIS — J301 Allergic rhinitis due to pollen: Secondary | ICD-10-CM | POA: Diagnosis not present

## 2021-03-27 DIAGNOSIS — J4550 Severe persistent asthma, uncomplicated: Secondary | ICD-10-CM | POA: Diagnosis not present

## 2021-03-27 DIAGNOSIS — J3089 Other allergic rhinitis: Secondary | ICD-10-CM | POA: Diagnosis not present

## 2021-03-27 DIAGNOSIS — Z91018 Allergy to other foods: Secondary | ICD-10-CM | POA: Diagnosis not present

## 2021-04-05 DIAGNOSIS — J3081 Allergic rhinitis due to animal (cat) (dog) hair and dander: Secondary | ICD-10-CM | POA: Diagnosis not present

## 2021-04-05 DIAGNOSIS — J301 Allergic rhinitis due to pollen: Secondary | ICD-10-CM | POA: Diagnosis not present

## 2021-04-08 DIAGNOSIS — J3089 Other allergic rhinitis: Secondary | ICD-10-CM | POA: Diagnosis not present

## 2021-05-07 DIAGNOSIS — J301 Allergic rhinitis due to pollen: Secondary | ICD-10-CM | POA: Diagnosis not present

## 2021-05-07 DIAGNOSIS — J3089 Other allergic rhinitis: Secondary | ICD-10-CM | POA: Diagnosis not present

## 2021-05-07 DIAGNOSIS — J3081 Allergic rhinitis due to animal (cat) (dog) hair and dander: Secondary | ICD-10-CM | POA: Diagnosis not present

## 2021-05-10 DIAGNOSIS — J3089 Other allergic rhinitis: Secondary | ICD-10-CM | POA: Diagnosis not present

## 2021-05-10 DIAGNOSIS — J3081 Allergic rhinitis due to animal (cat) (dog) hair and dander: Secondary | ICD-10-CM | POA: Diagnosis not present

## 2021-05-10 DIAGNOSIS — J301 Allergic rhinitis due to pollen: Secondary | ICD-10-CM | POA: Diagnosis not present

## 2021-05-10 DIAGNOSIS — Z91018 Allergy to other foods: Secondary | ICD-10-CM | POA: Diagnosis not present

## 2021-05-15 DIAGNOSIS — J301 Allergic rhinitis due to pollen: Secondary | ICD-10-CM | POA: Diagnosis not present

## 2021-05-15 DIAGNOSIS — J3081 Allergic rhinitis due to animal (cat) (dog) hair and dander: Secondary | ICD-10-CM | POA: Diagnosis not present

## 2021-05-15 DIAGNOSIS — J3089 Other allergic rhinitis: Secondary | ICD-10-CM | POA: Diagnosis not present

## 2021-05-17 DIAGNOSIS — J3089 Other allergic rhinitis: Secondary | ICD-10-CM | POA: Diagnosis not present

## 2021-05-17 DIAGNOSIS — J3081 Allergic rhinitis due to animal (cat) (dog) hair and dander: Secondary | ICD-10-CM | POA: Diagnosis not present

## 2021-05-17 DIAGNOSIS — J301 Allergic rhinitis due to pollen: Secondary | ICD-10-CM | POA: Diagnosis not present

## 2021-05-22 DIAGNOSIS — J3089 Other allergic rhinitis: Secondary | ICD-10-CM | POA: Diagnosis not present

## 2021-05-22 DIAGNOSIS — J3081 Allergic rhinitis due to animal (cat) (dog) hair and dander: Secondary | ICD-10-CM | POA: Diagnosis not present

## 2021-05-22 DIAGNOSIS — J301 Allergic rhinitis due to pollen: Secondary | ICD-10-CM | POA: Diagnosis not present

## 2021-05-27 DIAGNOSIS — J3089 Other allergic rhinitis: Secondary | ICD-10-CM | POA: Diagnosis not present

## 2021-05-27 DIAGNOSIS — J3081 Allergic rhinitis due to animal (cat) (dog) hair and dander: Secondary | ICD-10-CM | POA: Diagnosis not present

## 2021-05-27 DIAGNOSIS — J301 Allergic rhinitis due to pollen: Secondary | ICD-10-CM | POA: Diagnosis not present

## 2021-05-31 DIAGNOSIS — J3081 Allergic rhinitis due to animal (cat) (dog) hair and dander: Secondary | ICD-10-CM | POA: Diagnosis not present

## 2021-05-31 DIAGNOSIS — J301 Allergic rhinitis due to pollen: Secondary | ICD-10-CM | POA: Diagnosis not present

## 2021-05-31 DIAGNOSIS — J3089 Other allergic rhinitis: Secondary | ICD-10-CM | POA: Diagnosis not present

## 2021-06-06 DIAGNOSIS — J301 Allergic rhinitis due to pollen: Secondary | ICD-10-CM | POA: Diagnosis not present

## 2021-06-06 DIAGNOSIS — J3089 Other allergic rhinitis: Secondary | ICD-10-CM | POA: Diagnosis not present

## 2021-06-06 DIAGNOSIS — J3081 Allergic rhinitis due to animal (cat) (dog) hair and dander: Secondary | ICD-10-CM | POA: Diagnosis not present

## 2021-06-11 DIAGNOSIS — J3081 Allergic rhinitis due to animal (cat) (dog) hair and dander: Secondary | ICD-10-CM | POA: Diagnosis not present

## 2021-06-11 DIAGNOSIS — J3089 Other allergic rhinitis: Secondary | ICD-10-CM | POA: Diagnosis not present

## 2021-06-11 DIAGNOSIS — J301 Allergic rhinitis due to pollen: Secondary | ICD-10-CM | POA: Diagnosis not present

## 2021-06-25 DIAGNOSIS — J3081 Allergic rhinitis due to animal (cat) (dog) hair and dander: Secondary | ICD-10-CM | POA: Diagnosis not present

## 2021-06-25 DIAGNOSIS — J301 Allergic rhinitis due to pollen: Secondary | ICD-10-CM | POA: Diagnosis not present

## 2021-06-25 DIAGNOSIS — J3089 Other allergic rhinitis: Secondary | ICD-10-CM | POA: Diagnosis not present

## 2021-06-27 DIAGNOSIS — J301 Allergic rhinitis due to pollen: Secondary | ICD-10-CM | POA: Diagnosis not present

## 2021-06-27 DIAGNOSIS — J3081 Allergic rhinitis due to animal (cat) (dog) hair and dander: Secondary | ICD-10-CM | POA: Diagnosis not present

## 2021-06-27 DIAGNOSIS — J3089 Other allergic rhinitis: Secondary | ICD-10-CM | POA: Diagnosis not present

## 2021-07-04 DIAGNOSIS — J3089 Other allergic rhinitis: Secondary | ICD-10-CM | POA: Diagnosis not present

## 2021-07-04 DIAGNOSIS — J301 Allergic rhinitis due to pollen: Secondary | ICD-10-CM | POA: Diagnosis not present

## 2021-07-04 DIAGNOSIS — J3081 Allergic rhinitis due to animal (cat) (dog) hair and dander: Secondary | ICD-10-CM | POA: Diagnosis not present

## 2021-07-09 DIAGNOSIS — J3089 Other allergic rhinitis: Secondary | ICD-10-CM | POA: Diagnosis not present

## 2021-07-09 DIAGNOSIS — J3081 Allergic rhinitis due to animal (cat) (dog) hair and dander: Secondary | ICD-10-CM | POA: Diagnosis not present

## 2021-07-09 DIAGNOSIS — J301 Allergic rhinitis due to pollen: Secondary | ICD-10-CM | POA: Diagnosis not present

## 2021-07-11 DIAGNOSIS — J3081 Allergic rhinitis due to animal (cat) (dog) hair and dander: Secondary | ICD-10-CM | POA: Diagnosis not present

## 2021-07-11 DIAGNOSIS — J3089 Other allergic rhinitis: Secondary | ICD-10-CM | POA: Diagnosis not present

## 2021-07-11 DIAGNOSIS — J301 Allergic rhinitis due to pollen: Secondary | ICD-10-CM | POA: Diagnosis not present

## 2021-07-16 DIAGNOSIS — J3081 Allergic rhinitis due to animal (cat) (dog) hair and dander: Secondary | ICD-10-CM | POA: Diagnosis not present

## 2021-07-16 DIAGNOSIS — J301 Allergic rhinitis due to pollen: Secondary | ICD-10-CM | POA: Diagnosis not present

## 2021-07-16 DIAGNOSIS — J3089 Other allergic rhinitis: Secondary | ICD-10-CM | POA: Diagnosis not present

## 2021-07-24 DIAGNOSIS — J3089 Other allergic rhinitis: Secondary | ICD-10-CM | POA: Diagnosis not present

## 2021-07-24 DIAGNOSIS — J301 Allergic rhinitis due to pollen: Secondary | ICD-10-CM | POA: Diagnosis not present

## 2021-07-24 DIAGNOSIS — J3081 Allergic rhinitis due to animal (cat) (dog) hair and dander: Secondary | ICD-10-CM | POA: Diagnosis not present

## 2021-07-29 DIAGNOSIS — J301 Allergic rhinitis due to pollen: Secondary | ICD-10-CM | POA: Diagnosis not present

## 2021-07-29 DIAGNOSIS — J3089 Other allergic rhinitis: Secondary | ICD-10-CM | POA: Diagnosis not present

## 2021-07-29 DIAGNOSIS — J3081 Allergic rhinitis due to animal (cat) (dog) hair and dander: Secondary | ICD-10-CM | POA: Diagnosis not present

## 2021-07-31 DIAGNOSIS — J301 Allergic rhinitis due to pollen: Secondary | ICD-10-CM | POA: Diagnosis not present

## 2021-07-31 DIAGNOSIS — J3089 Other allergic rhinitis: Secondary | ICD-10-CM | POA: Diagnosis not present

## 2021-07-31 DIAGNOSIS — J3081 Allergic rhinitis due to animal (cat) (dog) hair and dander: Secondary | ICD-10-CM | POA: Diagnosis not present

## 2021-08-05 DIAGNOSIS — J3089 Other allergic rhinitis: Secondary | ICD-10-CM | POA: Diagnosis not present

## 2021-08-05 DIAGNOSIS — J301 Allergic rhinitis due to pollen: Secondary | ICD-10-CM | POA: Diagnosis not present

## 2021-08-05 DIAGNOSIS — J3081 Allergic rhinitis due to animal (cat) (dog) hair and dander: Secondary | ICD-10-CM | POA: Diagnosis not present

## 2021-08-07 DIAGNOSIS — J3089 Other allergic rhinitis: Secondary | ICD-10-CM | POA: Diagnosis not present

## 2021-08-07 DIAGNOSIS — J3081 Allergic rhinitis due to animal (cat) (dog) hair and dander: Secondary | ICD-10-CM | POA: Diagnosis not present

## 2021-08-07 DIAGNOSIS — J301 Allergic rhinitis due to pollen: Secondary | ICD-10-CM | POA: Diagnosis not present

## 2021-08-09 DIAGNOSIS — J301 Allergic rhinitis due to pollen: Secondary | ICD-10-CM | POA: Diagnosis not present

## 2021-08-09 DIAGNOSIS — J3081 Allergic rhinitis due to animal (cat) (dog) hair and dander: Secondary | ICD-10-CM | POA: Diagnosis not present

## 2021-08-09 DIAGNOSIS — J3089 Other allergic rhinitis: Secondary | ICD-10-CM | POA: Diagnosis not present

## 2021-08-15 DIAGNOSIS — J301 Allergic rhinitis due to pollen: Secondary | ICD-10-CM | POA: Diagnosis not present

## 2021-08-15 DIAGNOSIS — J3081 Allergic rhinitis due to animal (cat) (dog) hair and dander: Secondary | ICD-10-CM | POA: Diagnosis not present

## 2021-08-15 DIAGNOSIS — J3089 Other allergic rhinitis: Secondary | ICD-10-CM | POA: Diagnosis not present

## 2021-08-22 DIAGNOSIS — J301 Allergic rhinitis due to pollen: Secondary | ICD-10-CM | POA: Diagnosis not present

## 2021-08-22 DIAGNOSIS — J3081 Allergic rhinitis due to animal (cat) (dog) hair and dander: Secondary | ICD-10-CM | POA: Diagnosis not present

## 2021-08-22 DIAGNOSIS — J3089 Other allergic rhinitis: Secondary | ICD-10-CM | POA: Diagnosis not present

## 2021-08-29 DIAGNOSIS — J301 Allergic rhinitis due to pollen: Secondary | ICD-10-CM | POA: Diagnosis not present

## 2021-08-29 DIAGNOSIS — J3089 Other allergic rhinitis: Secondary | ICD-10-CM | POA: Diagnosis not present

## 2021-08-29 DIAGNOSIS — J3081 Allergic rhinitis due to animal (cat) (dog) hair and dander: Secondary | ICD-10-CM | POA: Diagnosis not present

## 2021-09-06 DIAGNOSIS — J3081 Allergic rhinitis due to animal (cat) (dog) hair and dander: Secondary | ICD-10-CM | POA: Diagnosis not present

## 2021-09-06 DIAGNOSIS — J3089 Other allergic rhinitis: Secondary | ICD-10-CM | POA: Diagnosis not present

## 2021-09-06 DIAGNOSIS — J301 Allergic rhinitis due to pollen: Secondary | ICD-10-CM | POA: Diagnosis not present

## 2021-09-13 DIAGNOSIS — J3089 Other allergic rhinitis: Secondary | ICD-10-CM | POA: Diagnosis not present

## 2021-09-13 DIAGNOSIS — J3081 Allergic rhinitis due to animal (cat) (dog) hair and dander: Secondary | ICD-10-CM | POA: Diagnosis not present

## 2021-09-13 DIAGNOSIS — J301 Allergic rhinitis due to pollen: Secondary | ICD-10-CM | POA: Diagnosis not present

## 2021-09-20 DIAGNOSIS — J301 Allergic rhinitis due to pollen: Secondary | ICD-10-CM | POA: Diagnosis not present

## 2021-09-20 DIAGNOSIS — J3089 Other allergic rhinitis: Secondary | ICD-10-CM | POA: Diagnosis not present

## 2021-09-20 DIAGNOSIS — J3081 Allergic rhinitis due to animal (cat) (dog) hair and dander: Secondary | ICD-10-CM | POA: Diagnosis not present

## 2021-09-26 DIAGNOSIS — J3089 Other allergic rhinitis: Secondary | ICD-10-CM | POA: Diagnosis not present

## 2021-09-26 DIAGNOSIS — J301 Allergic rhinitis due to pollen: Secondary | ICD-10-CM | POA: Diagnosis not present

## 2021-09-26 DIAGNOSIS — J3081 Allergic rhinitis due to animal (cat) (dog) hair and dander: Secondary | ICD-10-CM | POA: Diagnosis not present

## 2021-10-02 DIAGNOSIS — J301 Allergic rhinitis due to pollen: Secondary | ICD-10-CM | POA: Diagnosis not present

## 2021-10-02 DIAGNOSIS — J4550 Severe persistent asthma, uncomplicated: Secondary | ICD-10-CM | POA: Diagnosis not present

## 2021-10-02 DIAGNOSIS — Z91018 Allergy to other foods: Secondary | ICD-10-CM | POA: Diagnosis not present

## 2021-10-02 DIAGNOSIS — J3089 Other allergic rhinitis: Secondary | ICD-10-CM | POA: Diagnosis not present

## 2021-10-04 DIAGNOSIS — J301 Allergic rhinitis due to pollen: Secondary | ICD-10-CM | POA: Diagnosis not present

## 2021-10-04 DIAGNOSIS — J3081 Allergic rhinitis due to animal (cat) (dog) hair and dander: Secondary | ICD-10-CM | POA: Diagnosis not present

## 2021-10-04 DIAGNOSIS — J3089 Other allergic rhinitis: Secondary | ICD-10-CM | POA: Diagnosis not present

## 2021-10-11 DIAGNOSIS — J3089 Other allergic rhinitis: Secondary | ICD-10-CM | POA: Diagnosis not present

## 2021-10-11 DIAGNOSIS — J3081 Allergic rhinitis due to animal (cat) (dog) hair and dander: Secondary | ICD-10-CM | POA: Diagnosis not present

## 2021-10-11 DIAGNOSIS — J301 Allergic rhinitis due to pollen: Secondary | ICD-10-CM | POA: Diagnosis not present

## 2021-10-17 DIAGNOSIS — J301 Allergic rhinitis due to pollen: Secondary | ICD-10-CM | POA: Diagnosis not present

## 2021-10-17 DIAGNOSIS — J3089 Other allergic rhinitis: Secondary | ICD-10-CM | POA: Diagnosis not present

## 2021-10-17 DIAGNOSIS — J3081 Allergic rhinitis due to animal (cat) (dog) hair and dander: Secondary | ICD-10-CM | POA: Diagnosis not present

## 2021-10-24 DIAGNOSIS — J301 Allergic rhinitis due to pollen: Secondary | ICD-10-CM | POA: Diagnosis not present

## 2021-10-24 DIAGNOSIS — J3081 Allergic rhinitis due to animal (cat) (dog) hair and dander: Secondary | ICD-10-CM | POA: Diagnosis not present

## 2021-10-24 DIAGNOSIS — J3089 Other allergic rhinitis: Secondary | ICD-10-CM | POA: Diagnosis not present

## 2021-10-25 DIAGNOSIS — J3089 Other allergic rhinitis: Secondary | ICD-10-CM | POA: Diagnosis not present

## 2021-11-01 DIAGNOSIS — J3089 Other allergic rhinitis: Secondary | ICD-10-CM | POA: Diagnosis not present

## 2021-11-01 DIAGNOSIS — J3081 Allergic rhinitis due to animal (cat) (dog) hair and dander: Secondary | ICD-10-CM | POA: Diagnosis not present

## 2021-11-01 DIAGNOSIS — J301 Allergic rhinitis due to pollen: Secondary | ICD-10-CM | POA: Diagnosis not present

## 2021-11-07 DIAGNOSIS — J3089 Other allergic rhinitis: Secondary | ICD-10-CM | POA: Diagnosis not present

## 2021-11-07 DIAGNOSIS — J3081 Allergic rhinitis due to animal (cat) (dog) hair and dander: Secondary | ICD-10-CM | POA: Diagnosis not present

## 2021-11-07 DIAGNOSIS — J301 Allergic rhinitis due to pollen: Secondary | ICD-10-CM | POA: Diagnosis not present

## 2021-11-15 DIAGNOSIS — J3089 Other allergic rhinitis: Secondary | ICD-10-CM | POA: Diagnosis not present

## 2021-11-15 DIAGNOSIS — J3081 Allergic rhinitis due to animal (cat) (dog) hair and dander: Secondary | ICD-10-CM | POA: Diagnosis not present

## 2021-11-15 DIAGNOSIS — J301 Allergic rhinitis due to pollen: Secondary | ICD-10-CM | POA: Diagnosis not present

## 2021-11-18 DIAGNOSIS — J3089 Other allergic rhinitis: Secondary | ICD-10-CM | POA: Diagnosis not present

## 2021-11-18 DIAGNOSIS — J3081 Allergic rhinitis due to animal (cat) (dog) hair and dander: Secondary | ICD-10-CM | POA: Diagnosis not present

## 2021-11-18 DIAGNOSIS — J301 Allergic rhinitis due to pollen: Secondary | ICD-10-CM | POA: Diagnosis not present

## 2021-11-22 DIAGNOSIS — J3089 Other allergic rhinitis: Secondary | ICD-10-CM | POA: Diagnosis not present

## 2021-11-22 DIAGNOSIS — J3081 Allergic rhinitis due to animal (cat) (dog) hair and dander: Secondary | ICD-10-CM | POA: Diagnosis not present

## 2021-11-22 DIAGNOSIS — J301 Allergic rhinitis due to pollen: Secondary | ICD-10-CM | POA: Diagnosis not present

## 2021-11-25 DIAGNOSIS — J3089 Other allergic rhinitis: Secondary | ICD-10-CM | POA: Diagnosis not present

## 2021-11-25 DIAGNOSIS — J301 Allergic rhinitis due to pollen: Secondary | ICD-10-CM | POA: Diagnosis not present

## 2021-11-25 DIAGNOSIS — J3081 Allergic rhinitis due to animal (cat) (dog) hair and dander: Secondary | ICD-10-CM | POA: Diagnosis not present

## 2021-12-05 DIAGNOSIS — J3089 Other allergic rhinitis: Secondary | ICD-10-CM | POA: Diagnosis not present

## 2021-12-05 DIAGNOSIS — J301 Allergic rhinitis due to pollen: Secondary | ICD-10-CM | POA: Diagnosis not present

## 2021-12-05 DIAGNOSIS — J3081 Allergic rhinitis due to animal (cat) (dog) hair and dander: Secondary | ICD-10-CM | POA: Diagnosis not present

## 2021-12-12 DIAGNOSIS — J301 Allergic rhinitis due to pollen: Secondary | ICD-10-CM | POA: Diagnosis not present

## 2021-12-12 DIAGNOSIS — J3081 Allergic rhinitis due to animal (cat) (dog) hair and dander: Secondary | ICD-10-CM | POA: Diagnosis not present

## 2021-12-12 DIAGNOSIS — J3089 Other allergic rhinitis: Secondary | ICD-10-CM | POA: Diagnosis not present

## 2021-12-19 DIAGNOSIS — J3089 Other allergic rhinitis: Secondary | ICD-10-CM | POA: Diagnosis not present

## 2021-12-19 DIAGNOSIS — J301 Allergic rhinitis due to pollen: Secondary | ICD-10-CM | POA: Diagnosis not present

## 2021-12-19 DIAGNOSIS — J3081 Allergic rhinitis due to animal (cat) (dog) hair and dander: Secondary | ICD-10-CM | POA: Diagnosis not present

## 2021-12-31 DIAGNOSIS — J3089 Other allergic rhinitis: Secondary | ICD-10-CM | POA: Diagnosis not present

## 2021-12-31 DIAGNOSIS — J301 Allergic rhinitis due to pollen: Secondary | ICD-10-CM | POA: Diagnosis not present

## 2021-12-31 DIAGNOSIS — J3081 Allergic rhinitis due to animal (cat) (dog) hair and dander: Secondary | ICD-10-CM | POA: Diagnosis not present

## 2022-01-09 DIAGNOSIS — J301 Allergic rhinitis due to pollen: Secondary | ICD-10-CM | POA: Diagnosis not present

## 2022-01-09 DIAGNOSIS — J3089 Other allergic rhinitis: Secondary | ICD-10-CM | POA: Diagnosis not present

## 2022-01-09 DIAGNOSIS — J3081 Allergic rhinitis due to animal (cat) (dog) hair and dander: Secondary | ICD-10-CM | POA: Diagnosis not present

## 2022-01-16 DIAGNOSIS — J301 Allergic rhinitis due to pollen: Secondary | ICD-10-CM | POA: Diagnosis not present

## 2022-01-16 DIAGNOSIS — J3089 Other allergic rhinitis: Secondary | ICD-10-CM | POA: Diagnosis not present

## 2022-01-16 DIAGNOSIS — J3081 Allergic rhinitis due to animal (cat) (dog) hair and dander: Secondary | ICD-10-CM | POA: Diagnosis not present

## 2022-01-23 DIAGNOSIS — J301 Allergic rhinitis due to pollen: Secondary | ICD-10-CM | POA: Diagnosis not present

## 2022-01-23 DIAGNOSIS — J3089 Other allergic rhinitis: Secondary | ICD-10-CM | POA: Diagnosis not present

## 2022-01-23 DIAGNOSIS — J3081 Allergic rhinitis due to animal (cat) (dog) hair and dander: Secondary | ICD-10-CM | POA: Diagnosis not present

## 2022-01-30 DIAGNOSIS — J3089 Other allergic rhinitis: Secondary | ICD-10-CM | POA: Diagnosis not present

## 2022-01-30 DIAGNOSIS — J3081 Allergic rhinitis due to animal (cat) (dog) hair and dander: Secondary | ICD-10-CM | POA: Diagnosis not present

## 2022-01-30 DIAGNOSIS — J301 Allergic rhinitis due to pollen: Secondary | ICD-10-CM | POA: Diagnosis not present

## 2022-02-06 DIAGNOSIS — J3089 Other allergic rhinitis: Secondary | ICD-10-CM | POA: Diagnosis not present

## 2022-02-06 DIAGNOSIS — J3081 Allergic rhinitis due to animal (cat) (dog) hair and dander: Secondary | ICD-10-CM | POA: Diagnosis not present

## 2022-02-06 DIAGNOSIS — J301 Allergic rhinitis due to pollen: Secondary | ICD-10-CM | POA: Diagnosis not present

## 2022-02-13 DIAGNOSIS — J301 Allergic rhinitis due to pollen: Secondary | ICD-10-CM | POA: Diagnosis not present

## 2022-02-13 DIAGNOSIS — J3089 Other allergic rhinitis: Secondary | ICD-10-CM | POA: Diagnosis not present

## 2022-02-13 DIAGNOSIS — J3081 Allergic rhinitis due to animal (cat) (dog) hair and dander: Secondary | ICD-10-CM | POA: Diagnosis not present

## 2022-02-20 DIAGNOSIS — J3089 Other allergic rhinitis: Secondary | ICD-10-CM | POA: Diagnosis not present

## 2022-02-20 DIAGNOSIS — J3081 Allergic rhinitis due to animal (cat) (dog) hair and dander: Secondary | ICD-10-CM | POA: Diagnosis not present

## 2022-02-20 DIAGNOSIS — J301 Allergic rhinitis due to pollen: Secondary | ICD-10-CM | POA: Diagnosis not present

## 2022-02-27 DIAGNOSIS — J3089 Other allergic rhinitis: Secondary | ICD-10-CM | POA: Diagnosis not present

## 2022-02-27 DIAGNOSIS — J3081 Allergic rhinitis due to animal (cat) (dog) hair and dander: Secondary | ICD-10-CM | POA: Diagnosis not present

## 2022-02-27 DIAGNOSIS — J301 Allergic rhinitis due to pollen: Secondary | ICD-10-CM | POA: Diagnosis not present

## 2022-02-28 DIAGNOSIS — Z1231 Encounter for screening mammogram for malignant neoplasm of breast: Secondary | ICD-10-CM | POA: Diagnosis not present

## 2022-03-06 DIAGNOSIS — J301 Allergic rhinitis due to pollen: Secondary | ICD-10-CM | POA: Diagnosis not present

## 2022-03-06 DIAGNOSIS — J3089 Other allergic rhinitis: Secondary | ICD-10-CM | POA: Diagnosis not present

## 2022-03-06 DIAGNOSIS — J3081 Allergic rhinitis due to animal (cat) (dog) hair and dander: Secondary | ICD-10-CM | POA: Diagnosis not present

## 2022-03-13 DIAGNOSIS — J3081 Allergic rhinitis due to animal (cat) (dog) hair and dander: Secondary | ICD-10-CM | POA: Diagnosis not present

## 2022-03-13 DIAGNOSIS — J301 Allergic rhinitis due to pollen: Secondary | ICD-10-CM | POA: Diagnosis not present

## 2022-03-14 DIAGNOSIS — J3089 Other allergic rhinitis: Secondary | ICD-10-CM | POA: Diagnosis not present

## 2022-03-18 DIAGNOSIS — J3081 Allergic rhinitis due to animal (cat) (dog) hair and dander: Secondary | ICD-10-CM | POA: Diagnosis not present

## 2022-03-18 DIAGNOSIS — J3089 Other allergic rhinitis: Secondary | ICD-10-CM | POA: Diagnosis not present

## 2022-03-18 DIAGNOSIS — J301 Allergic rhinitis due to pollen: Secondary | ICD-10-CM | POA: Diagnosis not present

## 2022-03-26 DIAGNOSIS — J301 Allergic rhinitis due to pollen: Secondary | ICD-10-CM | POA: Diagnosis not present

## 2022-03-26 DIAGNOSIS — J3089 Other allergic rhinitis: Secondary | ICD-10-CM | POA: Diagnosis not present

## 2022-03-26 DIAGNOSIS — J3081 Allergic rhinitis due to animal (cat) (dog) hair and dander: Secondary | ICD-10-CM | POA: Diagnosis not present

## 2022-04-15 DIAGNOSIS — J3081 Allergic rhinitis due to animal (cat) (dog) hair and dander: Secondary | ICD-10-CM | POA: Diagnosis not present

## 2022-04-15 DIAGNOSIS — J301 Allergic rhinitis due to pollen: Secondary | ICD-10-CM | POA: Diagnosis not present

## 2022-04-15 DIAGNOSIS — J3089 Other allergic rhinitis: Secondary | ICD-10-CM | POA: Diagnosis not present

## 2022-05-01 DIAGNOSIS — J3089 Other allergic rhinitis: Secondary | ICD-10-CM | POA: Diagnosis not present

## 2022-05-01 DIAGNOSIS — J301 Allergic rhinitis due to pollen: Secondary | ICD-10-CM | POA: Diagnosis not present

## 2022-05-01 DIAGNOSIS — J3081 Allergic rhinitis due to animal (cat) (dog) hair and dander: Secondary | ICD-10-CM | POA: Diagnosis not present

## 2022-05-08 DIAGNOSIS — J301 Allergic rhinitis due to pollen: Secondary | ICD-10-CM | POA: Diagnosis not present

## 2022-05-08 DIAGNOSIS — J3081 Allergic rhinitis due to animal (cat) (dog) hair and dander: Secondary | ICD-10-CM | POA: Diagnosis not present

## 2022-05-08 DIAGNOSIS — J3089 Other allergic rhinitis: Secondary | ICD-10-CM | POA: Diagnosis not present

## 2022-05-22 DIAGNOSIS — J3081 Allergic rhinitis due to animal (cat) (dog) hair and dander: Secondary | ICD-10-CM | POA: Diagnosis not present

## 2022-05-22 DIAGNOSIS — J3089 Other allergic rhinitis: Secondary | ICD-10-CM | POA: Diagnosis not present

## 2022-05-22 DIAGNOSIS — J301 Allergic rhinitis due to pollen: Secondary | ICD-10-CM | POA: Diagnosis not present

## 2022-05-29 DIAGNOSIS — J301 Allergic rhinitis due to pollen: Secondary | ICD-10-CM | POA: Diagnosis not present

## 2022-05-29 DIAGNOSIS — J3081 Allergic rhinitis due to animal (cat) (dog) hair and dander: Secondary | ICD-10-CM | POA: Diagnosis not present

## 2022-05-29 DIAGNOSIS — J3089 Other allergic rhinitis: Secondary | ICD-10-CM | POA: Diagnosis not present

## 2022-06-05 DIAGNOSIS — J3089 Other allergic rhinitis: Secondary | ICD-10-CM | POA: Diagnosis not present

## 2022-06-05 DIAGNOSIS — J3081 Allergic rhinitis due to animal (cat) (dog) hair and dander: Secondary | ICD-10-CM | POA: Diagnosis not present

## 2022-06-05 DIAGNOSIS — J301 Allergic rhinitis due to pollen: Secondary | ICD-10-CM | POA: Diagnosis not present

## 2022-06-12 DIAGNOSIS — J3081 Allergic rhinitis due to animal (cat) (dog) hair and dander: Secondary | ICD-10-CM | POA: Diagnosis not present

## 2022-06-12 DIAGNOSIS — J301 Allergic rhinitis due to pollen: Secondary | ICD-10-CM | POA: Diagnosis not present

## 2022-06-12 DIAGNOSIS — J3089 Other allergic rhinitis: Secondary | ICD-10-CM | POA: Diagnosis not present

## 2022-06-19 DIAGNOSIS — J4550 Severe persistent asthma, uncomplicated: Secondary | ICD-10-CM | POA: Diagnosis not present

## 2022-06-19 DIAGNOSIS — J3089 Other allergic rhinitis: Secondary | ICD-10-CM | POA: Diagnosis not present

## 2022-06-19 DIAGNOSIS — Z91018 Allergy to other foods: Secondary | ICD-10-CM | POA: Diagnosis not present

## 2022-06-19 DIAGNOSIS — J3081 Allergic rhinitis due to animal (cat) (dog) hair and dander: Secondary | ICD-10-CM | POA: Diagnosis not present

## 2022-06-19 DIAGNOSIS — J301 Allergic rhinitis due to pollen: Secondary | ICD-10-CM | POA: Diagnosis not present

## 2022-06-26 DIAGNOSIS — J301 Allergic rhinitis due to pollen: Secondary | ICD-10-CM | POA: Diagnosis not present

## 2022-06-26 DIAGNOSIS — J3089 Other allergic rhinitis: Secondary | ICD-10-CM | POA: Diagnosis not present

## 2022-06-26 DIAGNOSIS — J3081 Allergic rhinitis due to animal (cat) (dog) hair and dander: Secondary | ICD-10-CM | POA: Diagnosis not present

## 2022-07-03 DIAGNOSIS — J301 Allergic rhinitis due to pollen: Secondary | ICD-10-CM | POA: Diagnosis not present

## 2022-07-03 DIAGNOSIS — J3081 Allergic rhinitis due to animal (cat) (dog) hair and dander: Secondary | ICD-10-CM | POA: Diagnosis not present

## 2022-07-03 DIAGNOSIS — J3089 Other allergic rhinitis: Secondary | ICD-10-CM | POA: Diagnosis not present

## 2022-07-08 DIAGNOSIS — D485 Neoplasm of uncertain behavior of skin: Secondary | ICD-10-CM | POA: Diagnosis not present

## 2022-07-08 DIAGNOSIS — L84 Corns and callosities: Secondary | ICD-10-CM | POA: Diagnosis not present

## 2022-07-08 DIAGNOSIS — L438 Other lichen planus: Secondary | ICD-10-CM | POA: Diagnosis not present

## 2022-07-08 DIAGNOSIS — L570 Actinic keratosis: Secondary | ICD-10-CM | POA: Diagnosis not present

## 2022-07-08 DIAGNOSIS — L821 Other seborrheic keratosis: Secondary | ICD-10-CM | POA: Diagnosis not present

## 2022-07-08 DIAGNOSIS — C4441 Basal cell carcinoma of skin of scalp and neck: Secondary | ICD-10-CM | POA: Diagnosis not present

## 2022-07-08 DIAGNOSIS — D2339 Other benign neoplasm of skin of other parts of face: Secondary | ICD-10-CM | POA: Diagnosis not present

## 2022-07-08 DIAGNOSIS — L918 Other hypertrophic disorders of the skin: Secondary | ICD-10-CM | POA: Diagnosis not present

## 2022-07-08 DIAGNOSIS — C44519 Basal cell carcinoma of skin of other part of trunk: Secondary | ICD-10-CM | POA: Diagnosis not present

## 2022-07-08 DIAGNOSIS — L720 Epidermal cyst: Secondary | ICD-10-CM | POA: Diagnosis not present

## 2022-07-17 DIAGNOSIS — J3081 Allergic rhinitis due to animal (cat) (dog) hair and dander: Secondary | ICD-10-CM | POA: Diagnosis not present

## 2022-07-17 DIAGNOSIS — J301 Allergic rhinitis due to pollen: Secondary | ICD-10-CM | POA: Diagnosis not present

## 2022-07-17 DIAGNOSIS — J3089 Other allergic rhinitis: Secondary | ICD-10-CM | POA: Diagnosis not present

## 2022-07-24 DIAGNOSIS — J3089 Other allergic rhinitis: Secondary | ICD-10-CM | POA: Diagnosis not present

## 2022-07-24 DIAGNOSIS — J301 Allergic rhinitis due to pollen: Secondary | ICD-10-CM | POA: Diagnosis not present

## 2022-07-24 DIAGNOSIS — J3081 Allergic rhinitis due to animal (cat) (dog) hair and dander: Secondary | ICD-10-CM | POA: Diagnosis not present

## 2022-07-31 DIAGNOSIS — J3089 Other allergic rhinitis: Secondary | ICD-10-CM | POA: Diagnosis not present

## 2022-07-31 DIAGNOSIS — J3081 Allergic rhinitis due to animal (cat) (dog) hair and dander: Secondary | ICD-10-CM | POA: Diagnosis not present

## 2022-07-31 DIAGNOSIS — J301 Allergic rhinitis due to pollen: Secondary | ICD-10-CM | POA: Diagnosis not present

## 2022-08-07 DIAGNOSIS — J301 Allergic rhinitis due to pollen: Secondary | ICD-10-CM | POA: Diagnosis not present

## 2022-08-07 DIAGNOSIS — J3089 Other allergic rhinitis: Secondary | ICD-10-CM | POA: Diagnosis not present

## 2022-08-07 DIAGNOSIS — J3081 Allergic rhinitis due to animal (cat) (dog) hair and dander: Secondary | ICD-10-CM | POA: Diagnosis not present

## 2022-08-14 DIAGNOSIS — J301 Allergic rhinitis due to pollen: Secondary | ICD-10-CM | POA: Diagnosis not present

## 2022-08-14 DIAGNOSIS — J3081 Allergic rhinitis due to animal (cat) (dog) hair and dander: Secondary | ICD-10-CM | POA: Diagnosis not present

## 2022-08-14 DIAGNOSIS — J3089 Other allergic rhinitis: Secondary | ICD-10-CM | POA: Diagnosis not present

## 2022-08-21 DIAGNOSIS — J301 Allergic rhinitis due to pollen: Secondary | ICD-10-CM | POA: Diagnosis not present

## 2022-08-21 DIAGNOSIS — J3081 Allergic rhinitis due to animal (cat) (dog) hair and dander: Secondary | ICD-10-CM | POA: Diagnosis not present

## 2022-08-21 DIAGNOSIS — J3089 Other allergic rhinitis: Secondary | ICD-10-CM | POA: Diagnosis not present

## 2022-08-28 DIAGNOSIS — J3089 Other allergic rhinitis: Secondary | ICD-10-CM | POA: Diagnosis not present

## 2022-08-28 DIAGNOSIS — J3081 Allergic rhinitis due to animal (cat) (dog) hair and dander: Secondary | ICD-10-CM | POA: Diagnosis not present

## 2022-08-28 DIAGNOSIS — J301 Allergic rhinitis due to pollen: Secondary | ICD-10-CM | POA: Diagnosis not present

## 2022-09-04 DIAGNOSIS — J3081 Allergic rhinitis due to animal (cat) (dog) hair and dander: Secondary | ICD-10-CM | POA: Diagnosis not present

## 2022-09-04 DIAGNOSIS — J301 Allergic rhinitis due to pollen: Secondary | ICD-10-CM | POA: Diagnosis not present

## 2022-09-04 DIAGNOSIS — J3089 Other allergic rhinitis: Secondary | ICD-10-CM | POA: Diagnosis not present

## 2022-09-15 DIAGNOSIS — J3081 Allergic rhinitis due to animal (cat) (dog) hair and dander: Secondary | ICD-10-CM | POA: Diagnosis not present

## 2022-09-15 DIAGNOSIS — J3089 Other allergic rhinitis: Secondary | ICD-10-CM | POA: Diagnosis not present

## 2022-09-15 DIAGNOSIS — J301 Allergic rhinitis due to pollen: Secondary | ICD-10-CM | POA: Diagnosis not present

## 2022-09-25 DIAGNOSIS — J301 Allergic rhinitis due to pollen: Secondary | ICD-10-CM | POA: Diagnosis not present

## 2022-09-25 DIAGNOSIS — J3081 Allergic rhinitis due to animal (cat) (dog) hair and dander: Secondary | ICD-10-CM | POA: Diagnosis not present

## 2022-09-25 DIAGNOSIS — J3089 Other allergic rhinitis: Secondary | ICD-10-CM | POA: Diagnosis not present

## 2022-10-02 DIAGNOSIS — J301 Allergic rhinitis due to pollen: Secondary | ICD-10-CM | POA: Diagnosis not present

## 2022-10-02 DIAGNOSIS — J3089 Other allergic rhinitis: Secondary | ICD-10-CM | POA: Diagnosis not present

## 2022-10-02 DIAGNOSIS — J3081 Allergic rhinitis due to animal (cat) (dog) hair and dander: Secondary | ICD-10-CM | POA: Diagnosis not present

## 2022-10-09 DIAGNOSIS — J3089 Other allergic rhinitis: Secondary | ICD-10-CM | POA: Diagnosis not present

## 2022-10-09 DIAGNOSIS — J301 Allergic rhinitis due to pollen: Secondary | ICD-10-CM | POA: Diagnosis not present

## 2022-10-09 DIAGNOSIS — J3081 Allergic rhinitis due to animal (cat) (dog) hair and dander: Secondary | ICD-10-CM | POA: Diagnosis not present

## 2022-10-15 DIAGNOSIS — J3081 Allergic rhinitis due to animal (cat) (dog) hair and dander: Secondary | ICD-10-CM | POA: Diagnosis not present

## 2022-10-15 DIAGNOSIS — J301 Allergic rhinitis due to pollen: Secondary | ICD-10-CM | POA: Diagnosis not present

## 2022-10-16 DIAGNOSIS — J301 Allergic rhinitis due to pollen: Secondary | ICD-10-CM | POA: Diagnosis not present

## 2022-10-16 DIAGNOSIS — J3089 Other allergic rhinitis: Secondary | ICD-10-CM | POA: Diagnosis not present

## 2022-10-16 DIAGNOSIS — J3081 Allergic rhinitis due to animal (cat) (dog) hair and dander: Secondary | ICD-10-CM | POA: Diagnosis not present

## 2022-10-23 DIAGNOSIS — J301 Allergic rhinitis due to pollen: Secondary | ICD-10-CM | POA: Diagnosis not present

## 2022-10-23 DIAGNOSIS — J3089 Other allergic rhinitis: Secondary | ICD-10-CM | POA: Diagnosis not present

## 2022-10-23 DIAGNOSIS — J3081 Allergic rhinitis due to animal (cat) (dog) hair and dander: Secondary | ICD-10-CM | POA: Diagnosis not present

## 2022-10-30 DIAGNOSIS — J3081 Allergic rhinitis due to animal (cat) (dog) hair and dander: Secondary | ICD-10-CM | POA: Diagnosis not present

## 2022-10-30 DIAGNOSIS — J301 Allergic rhinitis due to pollen: Secondary | ICD-10-CM | POA: Diagnosis not present

## 2022-10-30 DIAGNOSIS — J3089 Other allergic rhinitis: Secondary | ICD-10-CM | POA: Diagnosis not present

## 2022-11-04 DIAGNOSIS — J3081 Allergic rhinitis due to animal (cat) (dog) hair and dander: Secondary | ICD-10-CM | POA: Diagnosis not present

## 2022-11-04 DIAGNOSIS — J3089 Other allergic rhinitis: Secondary | ICD-10-CM | POA: Diagnosis not present

## 2022-11-04 DIAGNOSIS — J301 Allergic rhinitis due to pollen: Secondary | ICD-10-CM | POA: Diagnosis not present

## 2022-11-20 DIAGNOSIS — J3089 Other allergic rhinitis: Secondary | ICD-10-CM | POA: Diagnosis not present

## 2022-11-20 DIAGNOSIS — J301 Allergic rhinitis due to pollen: Secondary | ICD-10-CM | POA: Diagnosis not present

## 2022-11-20 DIAGNOSIS — J3081 Allergic rhinitis due to animal (cat) (dog) hair and dander: Secondary | ICD-10-CM | POA: Diagnosis not present

## 2022-11-27 DIAGNOSIS — J3089 Other allergic rhinitis: Secondary | ICD-10-CM | POA: Diagnosis not present

## 2022-11-27 DIAGNOSIS — J3081 Allergic rhinitis due to animal (cat) (dog) hair and dander: Secondary | ICD-10-CM | POA: Diagnosis not present

## 2022-11-27 DIAGNOSIS — J301 Allergic rhinitis due to pollen: Secondary | ICD-10-CM | POA: Diagnosis not present

## 2022-12-02 DIAGNOSIS — J301 Allergic rhinitis due to pollen: Secondary | ICD-10-CM | POA: Diagnosis not present

## 2022-12-02 DIAGNOSIS — J3089 Other allergic rhinitis: Secondary | ICD-10-CM | POA: Diagnosis not present

## 2022-12-02 DIAGNOSIS — J3081 Allergic rhinitis due to animal (cat) (dog) hair and dander: Secondary | ICD-10-CM | POA: Diagnosis not present

## 2022-12-09 DIAGNOSIS — J3081 Allergic rhinitis due to animal (cat) (dog) hair and dander: Secondary | ICD-10-CM | POA: Diagnosis not present

## 2022-12-09 DIAGNOSIS — J301 Allergic rhinitis due to pollen: Secondary | ICD-10-CM | POA: Diagnosis not present

## 2022-12-09 DIAGNOSIS — J3089 Other allergic rhinitis: Secondary | ICD-10-CM | POA: Diagnosis not present

## 2022-12-16 DIAGNOSIS — J3081 Allergic rhinitis due to animal (cat) (dog) hair and dander: Secondary | ICD-10-CM | POA: Diagnosis not present

## 2022-12-16 DIAGNOSIS — J301 Allergic rhinitis due to pollen: Secondary | ICD-10-CM | POA: Diagnosis not present

## 2022-12-16 DIAGNOSIS — J3089 Other allergic rhinitis: Secondary | ICD-10-CM | POA: Diagnosis not present

## 2023-01-01 DIAGNOSIS — J3089 Other allergic rhinitis: Secondary | ICD-10-CM | POA: Diagnosis not present

## 2023-01-01 DIAGNOSIS — J3081 Allergic rhinitis due to animal (cat) (dog) hair and dander: Secondary | ICD-10-CM | POA: Diagnosis not present

## 2023-01-01 DIAGNOSIS — J301 Allergic rhinitis due to pollen: Secondary | ICD-10-CM | POA: Diagnosis not present

## 2023-01-08 DIAGNOSIS — J3081 Allergic rhinitis due to animal (cat) (dog) hair and dander: Secondary | ICD-10-CM | POA: Diagnosis not present

## 2023-01-08 DIAGNOSIS — J3089 Other allergic rhinitis: Secondary | ICD-10-CM | POA: Diagnosis not present

## 2023-01-08 DIAGNOSIS — J301 Allergic rhinitis due to pollen: Secondary | ICD-10-CM | POA: Diagnosis not present

## 2023-01-20 DIAGNOSIS — J3081 Allergic rhinitis due to animal (cat) (dog) hair and dander: Secondary | ICD-10-CM | POA: Diagnosis not present

## 2023-01-20 DIAGNOSIS — J3089 Other allergic rhinitis: Secondary | ICD-10-CM | POA: Diagnosis not present

## 2023-01-20 DIAGNOSIS — J301 Allergic rhinitis due to pollen: Secondary | ICD-10-CM | POA: Diagnosis not present

## 2023-02-05 DIAGNOSIS — J3081 Allergic rhinitis due to animal (cat) (dog) hair and dander: Secondary | ICD-10-CM | POA: Diagnosis not present

## 2023-02-05 DIAGNOSIS — J3089 Other allergic rhinitis: Secondary | ICD-10-CM | POA: Diagnosis not present

## 2023-02-05 DIAGNOSIS — J301 Allergic rhinitis due to pollen: Secondary | ICD-10-CM | POA: Diagnosis not present

## 2023-02-12 DIAGNOSIS — J3089 Other allergic rhinitis: Secondary | ICD-10-CM | POA: Diagnosis not present

## 2023-02-12 DIAGNOSIS — J301 Allergic rhinitis due to pollen: Secondary | ICD-10-CM | POA: Diagnosis not present

## 2023-02-12 DIAGNOSIS — J3081 Allergic rhinitis due to animal (cat) (dog) hair and dander: Secondary | ICD-10-CM | POA: Diagnosis not present

## 2023-02-19 DIAGNOSIS — J3089 Other allergic rhinitis: Secondary | ICD-10-CM | POA: Diagnosis not present

## 2023-02-19 DIAGNOSIS — J301 Allergic rhinitis due to pollen: Secondary | ICD-10-CM | POA: Diagnosis not present

## 2023-02-19 DIAGNOSIS — J3081 Allergic rhinitis due to animal (cat) (dog) hair and dander: Secondary | ICD-10-CM | POA: Diagnosis not present

## 2023-02-26 DIAGNOSIS — J301 Allergic rhinitis due to pollen: Secondary | ICD-10-CM | POA: Diagnosis not present

## 2023-02-26 DIAGNOSIS — J3081 Allergic rhinitis due to animal (cat) (dog) hair and dander: Secondary | ICD-10-CM | POA: Diagnosis not present

## 2023-02-26 DIAGNOSIS — J3089 Other allergic rhinitis: Secondary | ICD-10-CM | POA: Diagnosis not present

## 2023-03-05 DIAGNOSIS — J3089 Other allergic rhinitis: Secondary | ICD-10-CM | POA: Diagnosis not present

## 2023-03-05 DIAGNOSIS — J3081 Allergic rhinitis due to animal (cat) (dog) hair and dander: Secondary | ICD-10-CM | POA: Diagnosis not present

## 2023-03-05 DIAGNOSIS — J301 Allergic rhinitis due to pollen: Secondary | ICD-10-CM | POA: Diagnosis not present

## 2023-03-19 DIAGNOSIS — J301 Allergic rhinitis due to pollen: Secondary | ICD-10-CM | POA: Diagnosis not present

## 2023-03-19 DIAGNOSIS — J3089 Other allergic rhinitis: Secondary | ICD-10-CM | POA: Diagnosis not present

## 2023-03-19 DIAGNOSIS — J3081 Allergic rhinitis due to animal (cat) (dog) hair and dander: Secondary | ICD-10-CM | POA: Diagnosis not present

## 2023-04-01 DIAGNOSIS — J3089 Other allergic rhinitis: Secondary | ICD-10-CM | POA: Diagnosis not present

## 2023-04-01 DIAGNOSIS — J3081 Allergic rhinitis due to animal (cat) (dog) hair and dander: Secondary | ICD-10-CM | POA: Diagnosis not present

## 2023-04-01 DIAGNOSIS — J301 Allergic rhinitis due to pollen: Secondary | ICD-10-CM | POA: Diagnosis not present

## 2023-04-21 DIAGNOSIS — J3081 Allergic rhinitis due to animal (cat) (dog) hair and dander: Secondary | ICD-10-CM | POA: Diagnosis not present

## 2023-04-21 DIAGNOSIS — J301 Allergic rhinitis due to pollen: Secondary | ICD-10-CM | POA: Diagnosis not present

## 2023-04-21 DIAGNOSIS — J3089 Other allergic rhinitis: Secondary | ICD-10-CM | POA: Diagnosis not present

## 2023-05-04 DIAGNOSIS — J3089 Other allergic rhinitis: Secondary | ICD-10-CM | POA: Diagnosis not present

## 2023-05-04 DIAGNOSIS — J301 Allergic rhinitis due to pollen: Secondary | ICD-10-CM | POA: Diagnosis not present

## 2023-05-04 DIAGNOSIS — J3081 Allergic rhinitis due to animal (cat) (dog) hair and dander: Secondary | ICD-10-CM | POA: Diagnosis not present

## 2023-05-21 DIAGNOSIS — J301 Allergic rhinitis due to pollen: Secondary | ICD-10-CM | POA: Diagnosis not present

## 2023-05-21 DIAGNOSIS — J3089 Other allergic rhinitis: Secondary | ICD-10-CM | POA: Diagnosis not present

## 2023-05-21 DIAGNOSIS — J3081 Allergic rhinitis due to animal (cat) (dog) hair and dander: Secondary | ICD-10-CM | POA: Diagnosis not present

## 2023-05-28 DIAGNOSIS — J301 Allergic rhinitis due to pollen: Secondary | ICD-10-CM | POA: Diagnosis not present

## 2023-05-28 DIAGNOSIS — J3081 Allergic rhinitis due to animal (cat) (dog) hair and dander: Secondary | ICD-10-CM | POA: Diagnosis not present

## 2023-05-28 DIAGNOSIS — J3089 Other allergic rhinitis: Secondary | ICD-10-CM | POA: Diagnosis not present

## 2023-05-29 DIAGNOSIS — Z1231 Encounter for screening mammogram for malignant neoplasm of breast: Secondary | ICD-10-CM | POA: Diagnosis not present

## 2023-05-29 LAB — HM MAMMOGRAPHY

## 2023-06-01 DIAGNOSIS — J301 Allergic rhinitis due to pollen: Secondary | ICD-10-CM | POA: Diagnosis not present

## 2023-06-01 DIAGNOSIS — J3081 Allergic rhinitis due to animal (cat) (dog) hair and dander: Secondary | ICD-10-CM | POA: Diagnosis not present

## 2023-06-02 ENCOUNTER — Encounter: Payer: Self-pay | Admitting: Family Medicine

## 2023-06-02 DIAGNOSIS — J3089 Other allergic rhinitis: Secondary | ICD-10-CM | POA: Diagnosis not present

## 2023-06-04 ENCOUNTER — Telehealth: Payer: Self-pay | Admitting: Family Medicine

## 2023-06-04 NOTE — Telephone Encounter (Signed)
Colleen, please advise. Thank you.

## 2023-06-05 ENCOUNTER — Encounter: Payer: Self-pay | Admitting: Family Medicine

## 2023-06-08 DIAGNOSIS — J3081 Allergic rhinitis due to animal (cat) (dog) hair and dander: Secondary | ICD-10-CM | POA: Diagnosis not present

## 2023-06-08 DIAGNOSIS — J3089 Other allergic rhinitis: Secondary | ICD-10-CM | POA: Diagnosis not present

## 2023-06-08 DIAGNOSIS — J301 Allergic rhinitis due to pollen: Secondary | ICD-10-CM | POA: Diagnosis not present

## 2023-06-22 DIAGNOSIS — J3081 Allergic rhinitis due to animal (cat) (dog) hair and dander: Secondary | ICD-10-CM | POA: Diagnosis not present

## 2023-06-22 DIAGNOSIS — J3089 Other allergic rhinitis: Secondary | ICD-10-CM | POA: Diagnosis not present

## 2023-06-22 DIAGNOSIS — J301 Allergic rhinitis due to pollen: Secondary | ICD-10-CM | POA: Diagnosis not present

## 2023-06-22 DIAGNOSIS — Z91018 Allergy to other foods: Secondary | ICD-10-CM | POA: Diagnosis not present

## 2023-06-22 DIAGNOSIS — J4550 Severe persistent asthma, uncomplicated: Secondary | ICD-10-CM | POA: Diagnosis not present

## 2023-07-09 DIAGNOSIS — J3089 Other allergic rhinitis: Secondary | ICD-10-CM | POA: Diagnosis not present

## 2023-07-09 DIAGNOSIS — J301 Allergic rhinitis due to pollen: Secondary | ICD-10-CM | POA: Diagnosis not present

## 2023-07-09 DIAGNOSIS — J3081 Allergic rhinitis due to animal (cat) (dog) hair and dander: Secondary | ICD-10-CM | POA: Diagnosis not present

## 2023-07-16 DIAGNOSIS — J3081 Allergic rhinitis due to animal (cat) (dog) hair and dander: Secondary | ICD-10-CM | POA: Diagnosis not present

## 2023-07-16 DIAGNOSIS — J3089 Other allergic rhinitis: Secondary | ICD-10-CM | POA: Diagnosis not present

## 2023-07-16 DIAGNOSIS — J301 Allergic rhinitis due to pollen: Secondary | ICD-10-CM | POA: Diagnosis not present

## 2023-07-23 DIAGNOSIS — J3081 Allergic rhinitis due to animal (cat) (dog) hair and dander: Secondary | ICD-10-CM | POA: Diagnosis not present

## 2023-07-23 DIAGNOSIS — J301 Allergic rhinitis due to pollen: Secondary | ICD-10-CM | POA: Diagnosis not present

## 2023-07-23 DIAGNOSIS — J3089 Other allergic rhinitis: Secondary | ICD-10-CM | POA: Diagnosis not present

## 2023-07-30 DIAGNOSIS — J3089 Other allergic rhinitis: Secondary | ICD-10-CM | POA: Diagnosis not present

## 2023-07-30 DIAGNOSIS — J301 Allergic rhinitis due to pollen: Secondary | ICD-10-CM | POA: Diagnosis not present

## 2023-07-30 DIAGNOSIS — J3081 Allergic rhinitis due to animal (cat) (dog) hair and dander: Secondary | ICD-10-CM | POA: Diagnosis not present

## 2023-08-04 ENCOUNTER — Ambulatory Visit: Payer: BC Managed Care – PPO | Admitting: Family Medicine

## 2023-08-06 DIAGNOSIS — J301 Allergic rhinitis due to pollen: Secondary | ICD-10-CM | POA: Diagnosis not present

## 2023-08-06 DIAGNOSIS — J3081 Allergic rhinitis due to animal (cat) (dog) hair and dander: Secondary | ICD-10-CM | POA: Diagnosis not present

## 2023-08-06 DIAGNOSIS — J3089 Other allergic rhinitis: Secondary | ICD-10-CM | POA: Diagnosis not present

## 2023-08-13 DIAGNOSIS — J3081 Allergic rhinitis due to animal (cat) (dog) hair and dander: Secondary | ICD-10-CM | POA: Diagnosis not present

## 2023-08-13 DIAGNOSIS — J3089 Other allergic rhinitis: Secondary | ICD-10-CM | POA: Diagnosis not present

## 2023-08-13 DIAGNOSIS — J301 Allergic rhinitis due to pollen: Secondary | ICD-10-CM | POA: Diagnosis not present

## 2023-08-14 ENCOUNTER — Other Ambulatory Visit: Payer: BC Managed Care – PPO

## 2023-08-14 DIAGNOSIS — Z13220 Encounter for screening for lipoid disorders: Secondary | ICD-10-CM | POA: Diagnosis not present

## 2023-08-14 DIAGNOSIS — E038 Other specified hypothyroidism: Secondary | ICD-10-CM | POA: Diagnosis not present

## 2023-08-14 DIAGNOSIS — J4531 Mild persistent asthma with (acute) exacerbation: Secondary | ICD-10-CM | POA: Insufficient documentation

## 2023-08-15 LAB — LIPID PANEL
Cholesterol: 208 mg/dL — ABNORMAL HIGH (ref <200)
HDL: 73 mg/dL (ref >=50)
LDL Cholesterol (Calc): 117 mg/dL — ABNORMAL HIGH
Non-HDL Cholesterol (Calc): 135 mg/dL — ABNORMAL HIGH (ref <130)
Total CHOL/HDL Ratio: 2.8 (calc) (ref <5.0)
Triglycerides: 81 mg/dL (ref <150)

## 2023-08-15 LAB — COMPLETE METABOLIC PANEL WITHOUT GFR
AG Ratio: 1.8 (calc) (ref 1.0–2.5)
ALT: 14 U/L (ref 6–29)
AST: 18 U/L (ref 10–35)
Albumin: 4.6 g/dL (ref 3.6–5.1)
Alkaline phosphatase (APISO): 74 U/L (ref 37–153)
BUN: 17 mg/dL (ref 7–25)
CO2: 27 mmol/L (ref 20–32)
Calcium: 9.5 mg/dL (ref 8.6–10.4)
Chloride: 102 mmol/L (ref 98–110)
Creat: 0.76 mg/dL (ref 0.50–1.05)
Globulin: 2.5 g/dL (ref 1.9–3.7)
Glucose, Bld: 73 mg/dL (ref 65–99)
Potassium: 5 mmol/L (ref 3.5–5.3)
Sodium: 140 mmol/L (ref 135–146)
Total Bilirubin: 0.6 mg/dL (ref 0.2–1.2)
Total Protein: 7.1 g/dL (ref 6.1–8.1)

## 2023-08-15 LAB — CBC WITH DIFFERENTIAL/PLATELET
Absolute Lymphocytes: 1849 {cells}/uL (ref 850–3900)
Absolute Monocytes: 536 {cells}/uL (ref 200–950)
Basophils Absolute: 87 {cells}/uL (ref 0–200)
Basophils Relative: 1.3 %
Eosinophils Absolute: 241 {cells}/uL (ref 15–500)
Eosinophils Relative: 3.6 %
HCT: 41.9 % (ref 35.0–45.0)
Hemoglobin: 13.6 g/dL (ref 11.7–15.5)
MCH: 29.1 pg (ref 27.0–33.0)
MCHC: 32.5 g/dL (ref 32.0–36.0)
MCV: 89.7 fL (ref 80.0–100.0)
MPV: 10.3 fL (ref 7.5–12.5)
Monocytes Relative: 8 %
Neutro Abs: 3987 {cells}/uL (ref 1500–7800)
Neutrophils Relative %: 59.5 %
Platelets: 326 10*3/uL (ref 140–400)
RBC: 4.67 10*6/uL (ref 3.80–5.10)
RDW: 13.2 % (ref 11.0–15.0)
Total Lymphocyte: 27.6 %
WBC: 6.7 10*3/uL (ref 3.8–10.8)

## 2023-08-15 LAB — TSH: TSH: 4.41 m[IU]/L (ref 0.40–4.50)

## 2023-08-17 ENCOUNTER — Encounter: Payer: Self-pay | Admitting: Family Medicine

## 2023-08-18 ENCOUNTER — Encounter: Payer: Self-pay | Admitting: Family Medicine

## 2023-08-18 ENCOUNTER — Ambulatory Visit: Payer: BC Managed Care – PPO | Admitting: Family Medicine

## 2023-08-18 VITALS — BP 118/78 | HR 65 | Temp 98.5°F | Ht 61.5 in | Wt 201.5 lb

## 2023-08-18 DIAGNOSIS — J4531 Mild persistent asthma with (acute) exacerbation: Secondary | ICD-10-CM

## 2023-08-18 DIAGNOSIS — Z1211 Encounter for screening for malignant neoplasm of colon: Secondary | ICD-10-CM | POA: Diagnosis not present

## 2023-08-18 DIAGNOSIS — Z23 Encounter for immunization: Secondary | ICD-10-CM

## 2023-08-18 DIAGNOSIS — Z0000 Encounter for general adult medical examination without abnormal findings: Secondary | ICD-10-CM

## 2023-08-18 NOTE — Progress Notes (Signed)
 Patient Office Visit  Assessment & Plan:  Screening for colon cancer -     Ambulatory referral to Gastroenterology  Need for Tdap vaccination -     Tdap vaccine greater than or equal to 61yo IM  Mild persistent asthma with acute exacerbation  Encounter for medical examination to establish care   Test results were reviewed and analyzed as part of the medical decision making of this visit.  Reviewed previous labs that were done a couple days ago during the office visit.  Also reviewed allergy notes during the office visit. Patient will set up a Physical and Pap smear next time.  Continue healthy diet and consistent exercise.  Tdap updated today.  Will give her Shingrix next time. Return if symptoms worsen or fail to improve, for physical.   Subjective:    Patient ID: Terri Bean, female    DOB: 01/28/1961  Age: 63 y.o. MRN: [id number]  Chief Complaint  Patient presents with   Establish Care   Medical Management of Chronic Issues    HPI Is here to establish primary care and also to discuss chronic issues which include allergies, asthma, elevated cholesterol.   Patient does have a previous history of subclinical hypothyroidism about 15 years ago she was on the low dose medication but then she stopped taking it and is doing fine. Allergic Rhinitis: Doing immunotherapy for allergic rhinitis. Patient's symptoms include cough, headaches, nasal congestion, postnasal drip, sinus pressure and sneezing. These symptoms are seasonal. Current triggers include exposure to pollens. The patient has been suffering from these symptoms for approximately several months. Pt taking Zyrtec, benadryl, singulair, allergy eye drops, allergy shots.  Asthma- pt does see [facility]. Pt is on Trelegy, albuterol prn. Pt has been under good control overall. Nonsmoker.  Hyperlipidemia-does not take chol med.   Aware of need for diet control, exercise and healthy eating.   Health  maintenance-patient needs tetanus shot, Shingrix vaccine, colonoscopy and Pap smear.  Patient has had a Pap smear maybe 8 years ago.  Patient is up-to-date on mammogram.  The 10-year ASCVD risk score (Arnett DK, et al., 2019) is: 2.4%  Past Medical History:  Diagnosis Date   Allergy    Asthma    Subclinical hypothyroidism    Past Surgical History:  Procedure Laterality Date   CESAREAN SECTION     x 2 1990 an 1998   MOUTH SURGERY     gum grafting   SEPTOPLASTY     63 years old?   Social History   Tobacco Use   Smoking status: Former    Current packs/day: 0.00    Types: Cigarettes    Quit date: 08/25/1988    Years since quitting: 35.0   Smokeless tobacco: Never  Vaping Use   Vaping status: Never Used  Substance Use Topics   Alcohol use: No   Drug use: No   Family History  Problem Relation Age of Onset   Ovarian cancer Mother        ovarian,peritoneal   Heart disease Father        hx MI,pacemaker   Hypertension Father    Breast cancer Sister    Colon cancer Neg Hx    Uterine cancer Neg Hx    Allergies  Allergen Reactions   Erythromycin     Severe GI upset    ROS    Objective:    BP 118/78   Pulse 65   Temp 98.5 F (36.9 C)   Ht  5' 1.5" (1.562 m)   Wt 201 lb 8 oz (91.4 kg)   SpO2 99%   BMI 37.46 kg/m  BP Readings from Last 3 Encounters:  08/18/23 118/78  09/06/14 124/76  06/16/13 126/84   Wt Readings from Last 3 Encounters:  08/18/23 201 lb 8 oz (91.4 kg)  06/16/13 154 lb (69.9 kg)    Physical Exam Vitals and nursing note reviewed.  Constitutional:      Appearance: Normal appearance.  HENT:     Head: Normocephalic.     Right Ear: Tympanic membrane, ear canal and external ear normal.     Left Ear: Tympanic membrane, ear canal and external ear normal.  Eyes:     Extraocular Movements: Extraocular movements intact.     Conjunctiva/sclera: Conjunctivae normal.     Pupils: Pupils are equal, round, and reactive to light.  Cardiovascular:      Rate and Rhythm: Normal rate and regular rhythm.     Heart sounds: Normal heart sounds.  Pulmonary:     Effort: Pulmonary effort is normal.     Breath sounds: Normal breath sounds.  Musculoskeletal:     Right lower leg: No edema.     Left lower leg: No edema.  Neurological:     General: No focal deficit present.     Mental Status: She is alert and oriented to person, place, and time.  Psychiatric:        Mood and Affect: Mood normal.        Behavior: Behavior normal.        Thought Content: Thought content normal.        Judgment: Judgment normal.      No results found for any visits on 08/18/23.

## 2023-08-25 DIAGNOSIS — J3081 Allergic rhinitis due to animal (cat) (dog) hair and dander: Secondary | ICD-10-CM | POA: Diagnosis not present

## 2023-08-25 DIAGNOSIS — J301 Allergic rhinitis due to pollen: Secondary | ICD-10-CM | POA: Diagnosis not present

## 2023-08-25 DIAGNOSIS — J3089 Other allergic rhinitis: Secondary | ICD-10-CM | POA: Diagnosis not present

## 2023-09-03 DIAGNOSIS — J301 Allergic rhinitis due to pollen: Secondary | ICD-10-CM | POA: Diagnosis not present

## 2023-09-03 DIAGNOSIS — J3081 Allergic rhinitis due to animal (cat) (dog) hair and dander: Secondary | ICD-10-CM | POA: Diagnosis not present

## 2023-09-03 DIAGNOSIS — J3089 Other allergic rhinitis: Secondary | ICD-10-CM | POA: Diagnosis not present

## 2023-09-08 ENCOUNTER — Encounter: Payer: Self-pay | Admitting: Pediatrics

## 2023-09-10 DIAGNOSIS — J3081 Allergic rhinitis due to animal (cat) (dog) hair and dander: Secondary | ICD-10-CM | POA: Diagnosis not present

## 2023-09-10 DIAGNOSIS — J301 Allergic rhinitis due to pollen: Secondary | ICD-10-CM | POA: Diagnosis not present

## 2023-09-10 DIAGNOSIS — J3089 Other allergic rhinitis: Secondary | ICD-10-CM | POA: Diagnosis not present

## 2023-09-17 ENCOUNTER — Encounter (HOSPITAL_COMMUNITY): Payer: Self-pay

## 2023-09-21 DIAGNOSIS — J301 Allergic rhinitis due to pollen: Secondary | ICD-10-CM | POA: Diagnosis not present

## 2023-09-21 DIAGNOSIS — J3089 Other allergic rhinitis: Secondary | ICD-10-CM | POA: Diagnosis not present

## 2023-09-21 DIAGNOSIS — J3081 Allergic rhinitis due to animal (cat) (dog) hair and dander: Secondary | ICD-10-CM | POA: Diagnosis not present

## 2023-09-24 ENCOUNTER — Encounter: Payer: Self-pay | Admitting: Pediatrics

## 2023-09-24 ENCOUNTER — Ambulatory Visit (AMBULATORY_SURGERY_CENTER)

## 2023-09-24 VITALS — Ht 62.5 in | Wt 197.0 lb

## 2023-09-24 DIAGNOSIS — Z1211 Encounter for screening for malignant neoplasm of colon: Secondary | ICD-10-CM

## 2023-09-24 MED ORDER — NA SULFATE-K SULFATE-MG SULF 17.5-3.13-1.6 GM/177ML PO SOLN: 1.0000 | Freq: Once | ORAL | 0 refills | Status: AC

## 2023-09-24 NOTE — Progress Notes (Signed)

## 2023-10-01 DIAGNOSIS — J3081 Allergic rhinitis due to animal (cat) (dog) hair and dander: Secondary | ICD-10-CM | POA: Diagnosis not present

## 2023-10-01 DIAGNOSIS — J301 Allergic rhinitis due to pollen: Secondary | ICD-10-CM | POA: Diagnosis not present

## 2023-10-01 DIAGNOSIS — J3089 Other allergic rhinitis: Secondary | ICD-10-CM | POA: Diagnosis not present

## 2023-10-06 NOTE — Progress Notes (Unsigned)
 [facility] History and Physical   Primary Care Physician:  Amadeo June, MD   Reason for Procedure:  Colorectal cancer screening  Plan:    Screening colonoscopy     HPI: Terri Bean is a 63 y.o. female undergoing screening colonoscopy for colorectal cancer screening.  Patient's chart does not document a prior colonoscopy.  There is no family history of colorectal cancer or polyps.  Patient denies rectal bleeding or change in bowel habits at the time of this exam.   Past Medical History:  Diagnosis Date   Allergy    Asthma    Subclinical hypothyroidism     Past Surgical History:  Procedure Laterality Date   CESAREAN SECTION     x 2 1990 an 1998   MOUTH SURGERY     gum grafting   SEPTOPLASTY     63 years old?    Prior to Admission medications   Medication Sig Start Date End Date Taking? Authorizing Provider  albuterol  (PROVENTIL  HFA;VENTOLIN  HFA) 108 (90 BASE) MCG/ACT inhaler Inhale 2 puffs into the lungs every 6 (six) hours as needed for wheezing. 06/16/13   Debbe Fail, PA-C  CALCIUM-MAGNESIUM-VITAMIN D  PO Take by mouth.    [provider]  cetirizine (ZYRTEC) 10 MG tablet Take 10 mg by mouth daily.    [provider]  diphenhydrAMINE (SOMINEX) 25 MG tablet Take 25 mg by mouth at bedtime as needed for sleep.    [provider]  montelukast (SINGULAIR) 10 MG tablet Take 10 mg by mouth daily. 06/21/23   [provider]  Olopatadine HCl 0.6 % SOLN Place into both nostrils. 06/22/23   [provider]  Gaylan Kaufman 200-62.5-25 MCG/ACT AEPB Inhale into the lungs daily. 06/22/23   [provider]  UNABLE TO FIND Immunotherapy done at allergist office. 05/07/21   [provider]  Vitamin D , Ergocalciferol , 50000 units CAPS 1 capsule.    [provider]    Current Outpatient Medications  Medication Sig Dispense Refill   albuterol  (PROVENTIL  HFA;VENTOLIN  HFA) 108 (90 BASE) MCG/ACT inhaler  Inhale 2 puffs into the lungs every 6 (six) hours as needed for wheezing. 1 Inhaler 0   CALCIUM-MAGNESIUM-VITAMIN D  PO Take by mouth.     cetirizine (ZYRTEC) 10 MG tablet Take 10 mg by mouth daily.     diphenhydrAMINE (SOMINEX) 25 MG tablet Take 25 mg by mouth at bedtime as needed for sleep.     montelukast (SINGULAIR) 10 MG tablet Take 10 mg by mouth daily.     Olopatadine HCl 0.6 % SOLN Place into both nostrils.     TRELEGY ELLIPTA 200-62.5-25 MCG/ACT AEPB Inhale into the lungs daily.     UNABLE TO FIND Immunotherapy done at allergist office.     Vitamin D , Ergocalciferol , 50000 units CAPS 1 capsule.     No current facility-administered medications for this visit.    Allergies as of 10/08/2023 - Review Complete 09/24/2023  Allergen Reaction Noted   Erythromycin  08/25/2012    Family History  Problem Relation Age of Onset   Ovarian cancer Mother        ovarian,peritoneal   Heart disease Father        hx MI,pacemaker   Hypertension Father    Breast cancer Sister    Colon cancer Neg Hx    Uterine cancer Neg Hx    Colon polyps Neg Hx    Esophageal cancer Neg Hx    Rectal cancer Neg Hx  Stomach cancer Neg Hx     Social History   Socioeconomic History   Marital status: Married    Spouse name: Not on file   Number of children: Not on file   Years of education: Not on file   Highest education level: Not on file  Occupational History   Not on file  Tobacco Use   Smoking status: Former    Current packs/day: 0.00    Types: Cigarettes    Quit date: 08/25/1988    Years since quitting: 35.1   Smokeless tobacco: Never  Vaping Use   Vaping status: Never Used  Substance and Sexual Activity   Alcohol use: No   Drug use: No   Sexual activity: Yes  Other Topics Concern   Not on file  Social History Narrative   Works as Environmental health practitioner.   Does no formal/routine exercise but is very active in general    eats fairly healthy diet.   Married.    Has 2 children.    Entered 08/2014: Her son graduates highschool this year. Then plans to go to [employer]--will live at home.    He has a lot of problems with Panic, Anxiety, Depression.    At times, when his problems are not stable, pt has to be present for a lot of appointments with him, etc.    Social Drivers of Health   Financial Resource Strain: Low Risk  (08/18/2023)   Overall Financial Resource Strain (CARDIA)    Difficulty of Paying Living Expenses: Not hard at all  Food Insecurity: No Food Insecurity (08/18/2023)   Hunger Vital Sign    Worried About Running Out of Food in the Last Year: Never true    Ran Out of Food in the Last Year: Never true  Transportation Needs: No Transportation Needs (08/18/2023)   PRAPARE - Administrator, Civil Service (Medical): No    Lack of Transportation (Non-Medical): No  Physical Activity: Not on file  Stress: No Stress Concern Present (08/18/2023)   [employer] of Occupational Health - Occupational Stress Questionnaire    Feeling of Stress : Not at all  Social Connections: Unknown (08/18/2023)   Social Connection and Isolation Panel [NHANES]    Frequency of Communication with Friends and Family: More than three times a week    Frequency of Social Gatherings with Friends and Family: More than three times a week    Attends Religious Services: Patient declined    Database administrator or Organizations: Patient declined    Attends Banker Meetings: Patient declined    Marital Status: Married  Catering manager Violence: Not At Risk (08/18/2023)   Humiliation, Afraid, Rape, and Kick questionnaire    Fear of Current or Ex-Partner: No    Emotionally Abused: No    Physically Abused: No    Sexually Abused: No    Review of Systems:  All other review of systems negative except as mentioned in the HPI.  Physical Exam: Vital signs LMP 08/25/2014   General:   Alert,  Well-developed, well-nourished, pleasant and cooperative in NAD Airway:  Mallampati   Lungs:  Clear throughout to auscultation.   Heart:  Regular rate and rhythm; no murmurs, clicks, rubs,  or gallops. Abdomen:  Soft, nontender and nondistended. Normal bowel sounds.   Neuro/Psych:  Normal mood and affect. A and O x 3  Eugenia Hess, MD [facility]

## 2023-10-08 ENCOUNTER — Encounter: Payer: Self-pay | Admitting: Pediatrics

## 2023-10-08 ENCOUNTER — Ambulatory Visit: Admitting: Pediatrics

## 2023-10-08 VITALS — BP 153/93 | HR 66 | Temp 98.1°F | Resp 14 | Ht 61.5 in | Wt 197.0 lb

## 2023-10-08 DIAGNOSIS — K648 Other hemorrhoids: Secondary | ICD-10-CM | POA: Diagnosis not present

## 2023-10-08 DIAGNOSIS — K644 Residual hemorrhoidal skin tags: Secondary | ICD-10-CM

## 2023-10-08 DIAGNOSIS — Z1211 Encounter for screening for malignant neoplasm of colon: Secondary | ICD-10-CM

## 2023-10-08 DIAGNOSIS — D122 Benign neoplasm of ascending colon: Secondary | ICD-10-CM

## 2023-10-08 MED ORDER — SODIUM CHLORIDE 0.9 % IV SOLN: 500.0000 mL | Freq: Once | INTRAVENOUS | Status: DC

## 2023-10-08 NOTE — Progress Notes (Signed)
 Called to room to assist during endoscopic procedure.  Patient ID and intended procedure confirmed with present staff. Received instructions for my participation in the procedure from the performing physician.

## 2023-10-08 NOTE — Patient Instructions (Signed)
Resume previous diet and medications. Awaiting pathology results. Repeat Colonoscopy date to be determined based on pathology results. Handout provided on colon polyps.  YOU HAD AN ENDOSCOPIC PROCEDURE TODAY AT THE [facility]:   Refer to the procedure report that was given to you for any specific questions about what was found during the examination.  If the procedure report does not answer your questions, please call your gastroenterologist to clarify.  If you requested that your care partner not be given the details of your procedure findings, then the procedure report has been included in a sealed envelope for you to review at your convenience later.  YOU SHOULD EXPECT: Some feelings of bloating in the abdomen. Passage of more gas than usual.  Walking can help get rid of the air that was put into your GI tract during the procedure and reduce the bloating. If you had a lower endoscopy (such as a colonoscopy or flexible sigmoidoscopy) you may notice spotting of blood in your stool or on the toilet paper. If you underwent a bowel prep for your procedure, you may not have a normal bowel movement for a few days.  Please Note:  You might notice some irritation and congestion in your nose or some drainage.  This is from the oxygen used during your procedure.  There is no need for concern and it should clear up in a day or so.  SYMPTOMS TO REPORT IMMEDIATELY:  Following lower endoscopy (colonoscopy or flexible sigmoidoscopy):  Excessive amounts of blood in the stool  Significant tenderness or worsening of abdominal pains  Swelling of the abdomen that is new, acute  Fever of 100F or higher   For urgent or emergent issues, a gastroenterologist can be reached at any hour by calling [phone]. Do not use MyChart messaging for urgent concerns.    DIET:  We do recommend a small meal at first, but then you may proceed to your regular diet.  Drink plenty of fluids but you should avoid  alcoholic beverages for 24 hours.  ACTIVITY:  You should plan to take it easy for the rest of today and you should NOT DRIVE or use heavy machinery until tomorrow (because of the sedation medicines used during the test).    FOLLOW UP: Our staff will call the number listed on your records the next business day following your procedure.  We will call around 7:15- 8:00 am to check on you and address any questions or concerns that you may have regarding the information given to you following your procedure. If we do not reach you, we will leave a message.     If any biopsies were taken you will be contacted by phone or by letter within the next 1-3 weeks.  Please call us at [phone] if you have not heard about the biopsies in 3 weeks.    SIGNATURES/CONFIDENTIALITY: You and/or your care partner have signed paperwork which will be entered into your electronic medical record.  These signatures attest to the fact that that the information above on your After Visit Summary has been reviewed and is understood.  Full responsibility of the confidentiality of this discharge information lies with you and/or your care-partner.

## 2023-10-08 NOTE — Progress Notes (Signed)
 Vss nad trans to pacu

## 2023-10-08 NOTE — Progress Notes (Signed)
 Pt's states no medical or surgical changes since previsit or office visit.

## 2023-10-08 NOTE — Op Note (Signed)
 [facility] Patient Name: Terri Bean Procedure Date: 10/08/2023 1:44 PM MRN: [id number] Endoscopist: Eugenia Hess , MD, [id number] Age: 63 Referring MD:  Date of Birth: 1960/07/18 Gender: Female Account #: [id number] Procedure:                Colonoscopy Indications:              Screening for colorectal malignant neoplasm, This                            is the patient's first colonoscopy Medicines:                Monitored Anesthesia Care Procedure:                Pre-Anesthesia Assessment:                           - Prior to the procedure, a History and Physical                            was performed, and patient medications and                            allergies were reviewed. The patient's tolerance of                            previous anesthesia was also reviewed. The risks                            and benefits of the procedure and the sedation                            options and risks were discussed with the patient.                            All questions were answered, and informed consent                            was obtained. Prior Anticoagulants: The patient has                            taken no anticoagulant or antiplatelet agents. ASA                            Grade Assessment: II - A patient with mild systemic                            disease. After reviewing the risks and benefits,                            the patient was deemed in satisfactory condition to                            undergo the procedure.  After obtaining informed consent, the colonoscope                            was passed under direct vision. Throughout the                            procedure, the patient's blood pressure, pulse, and                            oxygen saturations were monitored continuously. The                            CF [id number] #[id number] was introduced through the anus                            and advanced to the  cecum, identified by                            appendiceal orifice and ileocecal valve. The                            colonoscopy was performed without difficulty. The                            patient tolerated the procedure well. The quality                            of the bowel preparation was good. The ileocecal                            valve, appendiceal orifice, and rectum were                            photographed. Scope In: 2:06:37 PM Scope Out: 2:23:13 PM Scope Withdrawal Time: 0 hours 9 minutes 56 seconds  Total Procedure Duration: 0 hours 16 minutes 36 seconds  Findings:                 Skin tags were found on perianal exam.                           The digital rectal exam was normal. Pertinent                            negatives include normal sphincter tone and no                            palpable rectal lesions.                           A 3 mm polyp was found in the ascending colon. The                            polyp was sessile. The polyp was removed with a  cold biopsy forceps. Resection and retrieval were                            complete.                           Internal hemorrhoids were found during retroflexion. Complications:            No immediate complications. Estimated blood loss:                            Minimal. Estimated Blood Loss:     Estimated blood loss was minimal. Impression:               - Perianal skin tags found on perianal exam.                           - One 3 mm polyp in the ascending colon, removed                            with a cold biopsy forceps. Resected and retrieved.                           - Internal hemorrhoids. Recommendation:           - Discharge patient to home (ambulatory).                           - Await pathology results.                           - Repeat colonoscopy for surveillance based on                            pathology results.                           - The  findings and recommendations were discussed                            with the patient's family.                           - Return to referring physician.                           - Patient has a contact number available for                            emergencies. The signs and symptoms of potential                            delayed complications were discussed with the                            patient. Return to normal activities tomorrow.  Written discharge instructions were provided to the                            patient. Eugenia Hess, MD 10/08/2023 2:26:53 PM This report has been signed electronically.

## 2023-10-09 ENCOUNTER — Telehealth: Payer: Self-pay | Admitting: *Deleted

## 2023-10-09 NOTE — Telephone Encounter (Signed)
  Follow up Call-     10/08/2023    1:32 PM  Call back number  Post procedure Call Back phone  # [phone]  Permission to leave phone message Yes     Patient questions:  Do you have a fever, pain , or abdominal swelling? No. Pain Score  0 *  Have you tolerated food without any problems? Yes.    Have you been able to return to your normal activities? Yes.    Do you have any questions about your discharge instructions: Diet   No. Medications  No. Follow up visit  No.  Do you have questions or concerns about your Care? No.  Actions: * If pain score is 4 or above: No action needed, pain <4.

## 2023-10-13 ENCOUNTER — Ambulatory Visit: Payer: Self-pay | Admitting: Pediatrics

## 2023-10-13 LAB — SURGICAL PATHOLOGY

## 2023-10-14 DIAGNOSIS — J3081 Allergic rhinitis due to animal (cat) (dog) hair and dander: Secondary | ICD-10-CM | POA: Diagnosis not present

## 2023-10-14 DIAGNOSIS — J301 Allergic rhinitis due to pollen: Secondary | ICD-10-CM | POA: Diagnosis not present

## 2023-10-14 DIAGNOSIS — J3089 Other allergic rhinitis: Secondary | ICD-10-CM | POA: Diagnosis not present

## 2023-10-21 ENCOUNTER — Encounter: Admitting: Family Medicine

## 2023-10-21 DIAGNOSIS — J301 Allergic rhinitis due to pollen: Secondary | ICD-10-CM | POA: Diagnosis not present

## 2023-10-21 DIAGNOSIS — J3081 Allergic rhinitis due to animal (cat) (dog) hair and dander: Secondary | ICD-10-CM | POA: Diagnosis not present

## 2023-10-21 DIAGNOSIS — J3089 Other allergic rhinitis: Secondary | ICD-10-CM | POA: Diagnosis not present

## 2023-10-28 DIAGNOSIS — J301 Allergic rhinitis due to pollen: Secondary | ICD-10-CM | POA: Diagnosis not present

## 2023-10-28 DIAGNOSIS — J3089 Other allergic rhinitis: Secondary | ICD-10-CM | POA: Diagnosis not present

## 2023-10-28 DIAGNOSIS — J3081 Allergic rhinitis due to animal (cat) (dog) hair and dander: Secondary | ICD-10-CM | POA: Diagnosis not present

## 2023-11-09 DIAGNOSIS — J3081 Allergic rhinitis due to animal (cat) (dog) hair and dander: Secondary | ICD-10-CM | POA: Diagnosis not present

## 2023-11-09 DIAGNOSIS — J301 Allergic rhinitis due to pollen: Secondary | ICD-10-CM | POA: Diagnosis not present

## 2023-11-09 DIAGNOSIS — J3089 Other allergic rhinitis: Secondary | ICD-10-CM | POA: Diagnosis not present

## 2023-11-17 DIAGNOSIS — J301 Allergic rhinitis due to pollen: Secondary | ICD-10-CM | POA: Diagnosis not present

## 2023-11-17 DIAGNOSIS — J3089 Other allergic rhinitis: Secondary | ICD-10-CM | POA: Diagnosis not present

## 2023-11-17 DIAGNOSIS — J3081 Allergic rhinitis due to animal (cat) (dog) hair and dander: Secondary | ICD-10-CM | POA: Diagnosis not present

## 2023-11-24 DIAGNOSIS — J3081 Allergic rhinitis due to animal (cat) (dog) hair and dander: Secondary | ICD-10-CM | POA: Diagnosis not present

## 2023-11-24 DIAGNOSIS — J3089 Other allergic rhinitis: Secondary | ICD-10-CM | POA: Diagnosis not present

## 2023-11-24 DIAGNOSIS — Z91018 Allergy to other foods: Secondary | ICD-10-CM | POA: Diagnosis not present

## 2023-11-24 DIAGNOSIS — J4550 Severe persistent asthma, uncomplicated: Secondary | ICD-10-CM | POA: Diagnosis not present

## 2023-11-24 DIAGNOSIS — J301 Allergic rhinitis due to pollen: Secondary | ICD-10-CM | POA: Diagnosis not present

## 2023-12-10 DIAGNOSIS — J3089 Other allergic rhinitis: Secondary | ICD-10-CM | POA: Diagnosis not present

## 2023-12-10 DIAGNOSIS — J301 Allergic rhinitis due to pollen: Secondary | ICD-10-CM | POA: Diagnosis not present

## 2023-12-10 DIAGNOSIS — J3081 Allergic rhinitis due to animal (cat) (dog) hair and dander: Secondary | ICD-10-CM | POA: Diagnosis not present

## 2023-12-24 DIAGNOSIS — J3089 Other allergic rhinitis: Secondary | ICD-10-CM | POA: Diagnosis not present

## 2023-12-24 DIAGNOSIS — J3081 Allergic rhinitis due to animal (cat) (dog) hair and dander: Secondary | ICD-10-CM | POA: Diagnosis not present

## 2023-12-24 DIAGNOSIS — J301 Allergic rhinitis due to pollen: Secondary | ICD-10-CM | POA: Diagnosis not present

## 2023-12-31 DIAGNOSIS — J3089 Other allergic rhinitis: Secondary | ICD-10-CM | POA: Diagnosis not present

## 2023-12-31 DIAGNOSIS — J3081 Allergic rhinitis due to animal (cat) (dog) hair and dander: Secondary | ICD-10-CM | POA: Diagnosis not present

## 2023-12-31 DIAGNOSIS — J301 Allergic rhinitis due to pollen: Secondary | ICD-10-CM | POA: Diagnosis not present

## 2024-01-07 DIAGNOSIS — J3081 Allergic rhinitis due to animal (cat) (dog) hair and dander: Secondary | ICD-10-CM | POA: Diagnosis not present

## 2024-01-07 DIAGNOSIS — J3089 Other allergic rhinitis: Secondary | ICD-10-CM | POA: Diagnosis not present

## 2024-01-07 DIAGNOSIS — J301 Allergic rhinitis due to pollen: Secondary | ICD-10-CM | POA: Diagnosis not present

## 2024-01-14 DIAGNOSIS — J301 Allergic rhinitis due to pollen: Secondary | ICD-10-CM | POA: Diagnosis not present

## 2024-01-14 DIAGNOSIS — J3081 Allergic rhinitis due to animal (cat) (dog) hair and dander: Secondary | ICD-10-CM | POA: Diagnosis not present

## 2024-01-14 DIAGNOSIS — J3089 Other allergic rhinitis: Secondary | ICD-10-CM | POA: Diagnosis not present

## 2024-01-28 DIAGNOSIS — J3081 Allergic rhinitis due to animal (cat) (dog) hair and dander: Secondary | ICD-10-CM | POA: Diagnosis not present

## 2024-01-28 DIAGNOSIS — J301 Allergic rhinitis due to pollen: Secondary | ICD-10-CM | POA: Diagnosis not present

## 2024-01-28 DIAGNOSIS — J3089 Other allergic rhinitis: Secondary | ICD-10-CM | POA: Diagnosis not present

## 2024-02-11 DIAGNOSIS — J3081 Allergic rhinitis due to animal (cat) (dog) hair and dander: Secondary | ICD-10-CM | POA: Diagnosis not present

## 2024-02-11 DIAGNOSIS — J301 Allergic rhinitis due to pollen: Secondary | ICD-10-CM | POA: Diagnosis not present

## 2024-02-11 DIAGNOSIS — J3089 Other allergic rhinitis: Secondary | ICD-10-CM | POA: Diagnosis not present

## 2024-02-16 ENCOUNTER — Encounter: Admitting: Family Medicine

## 2024-02-18 DIAGNOSIS — J301 Allergic rhinitis due to pollen: Secondary | ICD-10-CM | POA: Diagnosis not present

## 2024-02-18 DIAGNOSIS — J3089 Other allergic rhinitis: Secondary | ICD-10-CM | POA: Diagnosis not present

## 2024-02-18 DIAGNOSIS — J3081 Allergic rhinitis due to animal (cat) (dog) hair and dander: Secondary | ICD-10-CM | POA: Diagnosis not present

## 2024-03-03 DIAGNOSIS — J301 Allergic rhinitis due to pollen: Secondary | ICD-10-CM | POA: Diagnosis not present

## 2024-03-03 DIAGNOSIS — J3081 Allergic rhinitis due to animal (cat) (dog) hair and dander: Secondary | ICD-10-CM | POA: Diagnosis not present

## 2024-03-04 DIAGNOSIS — J3089 Other allergic rhinitis: Secondary | ICD-10-CM | POA: Diagnosis not present

## 2024-03-09 DIAGNOSIS — J301 Allergic rhinitis due to pollen: Secondary | ICD-10-CM | POA: Diagnosis not present

## 2024-03-09 DIAGNOSIS — J3089 Other allergic rhinitis: Secondary | ICD-10-CM | POA: Diagnosis not present

## 2024-03-09 DIAGNOSIS — J3081 Allergic rhinitis due to animal (cat) (dog) hair and dander: Secondary | ICD-10-CM | POA: Diagnosis not present

## 2024-03-17 DIAGNOSIS — J3089 Other allergic rhinitis: Secondary | ICD-10-CM | POA: Diagnosis not present

## 2024-03-17 DIAGNOSIS — J301 Allergic rhinitis due to pollen: Secondary | ICD-10-CM | POA: Diagnosis not present

## 2024-03-17 DIAGNOSIS — J3081 Allergic rhinitis due to animal (cat) (dog) hair and dander: Secondary | ICD-10-CM | POA: Diagnosis not present

## 2024-03-24 DIAGNOSIS — J301 Allergic rhinitis due to pollen: Secondary | ICD-10-CM | POA: Diagnosis not present

## 2024-03-24 DIAGNOSIS — J3089 Other allergic rhinitis: Secondary | ICD-10-CM | POA: Diagnosis not present

## 2024-03-24 DIAGNOSIS — J3081 Allergic rhinitis due to animal (cat) (dog) hair and dander: Secondary | ICD-10-CM | POA: Diagnosis not present

## 2024-03-31 DIAGNOSIS — J3081 Allergic rhinitis due to animal (cat) (dog) hair and dander: Secondary | ICD-10-CM | POA: Diagnosis not present

## 2024-03-31 DIAGNOSIS — J301 Allergic rhinitis due to pollen: Secondary | ICD-10-CM | POA: Diagnosis not present

## 2024-03-31 DIAGNOSIS — J3089 Other allergic rhinitis: Secondary | ICD-10-CM | POA: Diagnosis not present

## 2024-04-05 DIAGNOSIS — J3081 Allergic rhinitis due to animal (cat) (dog) hair and dander: Secondary | ICD-10-CM | POA: Diagnosis not present

## 2024-04-05 DIAGNOSIS — J3089 Other allergic rhinitis: Secondary | ICD-10-CM | POA: Diagnosis not present

## 2024-04-05 DIAGNOSIS — J301 Allergic rhinitis due to pollen: Secondary | ICD-10-CM | POA: Diagnosis not present

## 2024-04-12 DIAGNOSIS — J3081 Allergic rhinitis due to animal (cat) (dog) hair and dander: Secondary | ICD-10-CM | POA: Diagnosis not present

## 2024-04-12 DIAGNOSIS — J301 Allergic rhinitis due to pollen: Secondary | ICD-10-CM | POA: Diagnosis not present

## 2024-04-12 DIAGNOSIS — J3089 Other allergic rhinitis: Secondary | ICD-10-CM | POA: Diagnosis not present

## 2024-05-27 ENCOUNTER — Encounter: Admitting: Family Medicine

## 2024-06-03 LAB — HM MAMMOGRAPHY

## 2024-06-07 ENCOUNTER — Encounter: Payer: Self-pay | Admitting: Family Medicine

## 2024-08-31 ENCOUNTER — Encounter: Admitting: Family Medicine
